# Patient Record
Sex: Male | Born: 1985 | Hispanic: Yes | Marital: Married
Health system: Midwestern US, Academic
[De-identification: ages and names within clinical notes are randomized; demographics above are authoritative.]

---

## 2017-02-09 MED ORDER — CEFAZOLIN INJ 1GM IVP
2 g | Freq: Once | INTRAVENOUS | 0 refills | Status: CN
Start: 2017-02-09 — End: ?

## 2017-02-22 MED ORDER — EPINEPHRINE 3MG NS 3000ML IRR(OR)
0 refills | Status: DC
Start: 2017-02-22 — End: 2017-02-23

## 2017-02-22 MED ORDER — ROCURONIUM 10 MG/ML IV SOLN
INTRAVENOUS | 0 refills | Status: DC
Start: 2017-02-22 — End: 2017-02-22

## 2017-02-22 MED ORDER — ONDANSETRON HCL (PF) 4 MG/2 ML IJ SOLN
4 mg | Freq: Once | INTRAVENOUS | 0 refills | Status: DC | PRN
Start: 2017-02-22 — End: 2017-02-23

## 2017-02-22 MED ORDER — CEFAZOLIN INJ 1GM IVP
2 g | Freq: Once | INTRAVENOUS | 0 refills | Status: CP
Start: 2017-02-22 — End: ?

## 2017-02-22 MED ORDER — ROPIVACAINE (PF) 2 MG/ML (0.2 %) IJ SOLN
0 refills | Status: DC
Start: 2017-02-22 — End: 2017-02-22

## 2017-02-22 MED ORDER — OXYCODONE SR 10 MG PO 12HR TABLET
10 mg | Freq: Once | ORAL | 0 refills | Status: CP
Start: 2017-02-22 — End: ?

## 2017-02-22 MED ORDER — OXYCODONE 5 MG PO TAB
5-10 mg | Freq: Once | ORAL | 0 refills | Status: CP | PRN
Start: 2017-02-22 — End: ?

## 2017-02-22 MED ORDER — SUGAMMADEX 100 MG/ML IV SOLN
INTRAVENOUS | 0 refills | Status: DC
Start: 2017-02-22 — End: 2017-02-22

## 2017-02-22 MED ORDER — FENTANYL CITRATE (PF) 50 MCG/ML IJ SOLN
0 refills | Status: DC
Start: 2017-02-22 — End: 2017-02-22

## 2017-02-22 MED ORDER — GABAPENTIN 300 MG PO CAP
300 mg | Freq: Once | ORAL | 0 refills | Status: CP
Start: 2017-02-22 — End: ?

## 2017-02-22 MED ORDER — PROMETHAZINE 25 MG/ML IJ SOLN
6.25 mg | INTRAVENOUS | 0 refills | Status: DC | PRN
Start: 2017-02-22 — End: 2017-02-23

## 2017-02-22 MED ORDER — MEPERIDINE (PF) 25 MG/ML IJ SYRG
12.5 mg | INTRAVENOUS | 0 refills | Status: DC | PRN
Start: 2017-02-22 — End: 2017-02-23

## 2017-02-22 MED ORDER — MIDAZOLAM 1 MG/ML IJ SOLN
INTRAVENOUS | 0 refills | Status: DC
Start: 2017-02-22 — End: 2017-02-22

## 2017-02-22 MED ORDER — PROPOFOL INJ 10 MG/ML IV VIAL
0 refills | Status: DC
Start: 2017-02-22 — End: 2017-02-22

## 2017-02-22 MED ORDER — HALOPERIDOL LACTATE 5 MG/ML IJ SOLN
1 mg | Freq: Once | INTRAVENOUS | 0 refills | Status: DC | PRN
Start: 2017-02-22 — End: 2017-02-23

## 2017-02-22 MED ORDER — OXYCODONE-ACETAMINOPHEN 5-325 MG PO TAB
1 | ORAL_TABLET | ORAL | 0 refills | 2.00000 days | Status: AC | PRN
Start: 2017-02-22 — End: ?

## 2017-02-22 MED ORDER — DEXAMETHASONE SODIUM PHOSPHATE 4 MG/ML IJ SOLN
INTRAVENOUS | 0 refills | Status: DC
Start: 2017-02-22 — End: 2017-02-22

## 2017-02-22 MED ORDER — ONDANSETRON HCL (PF) 4 MG/2 ML IJ SOLN
INTRAVENOUS | 0 refills | Status: DC
Start: 2017-02-22 — End: 2017-02-22

## 2017-02-22 MED ORDER — HYDROMORPHONE (PF) 2 MG/ML IJ SYRG
.5 mg | INTRAVENOUS | 0 refills | Status: DC | PRN
Start: 2017-02-22 — End: 2017-02-23

## 2017-02-22 MED ORDER — LIDOCAINE (PF) 200 MG/10 ML (2 %) IJ SYRG
0 refills | Status: DC
Start: 2017-02-22 — End: 2017-02-22

## 2017-02-22 MED ORDER — FENTANYL CITRATE (PF) 50 MCG/ML IJ SOLN
50 ug | INTRAVENOUS | 0 refills | Status: DC | PRN
Start: 2017-02-22 — End: 2017-02-23

## 2017-02-22 MED ORDER — DIAZEPAM 5 MG PO TAB
5 mg | ORAL_TABLET | ORAL | 0 refills | 7.00000 days | Status: AC | PRN
Start: 2017-02-22 — End: ?

## 2017-02-22 MED ORDER — ACETAMINOPHEN 1,000 MG/100 ML (10 MG/ML) IV SOLN
0 refills | Status: DC
Start: 2017-02-22 — End: 2017-02-22

## 2017-02-22 MED ORDER — LIDOCAINE (PF) 10 MG/ML (1 %) IJ SOLN
.1-2 mL | Freq: Once | INTRAMUSCULAR | 0 refills | Status: CP
Start: 2017-02-22 — End: ?

## 2017-02-22 MED ORDER — FENTANYL CITRATE (PF) 50 MCG/ML IJ SOLN
25 ug | INTRAVENOUS | 0 refills | Status: DC | PRN
Start: 2017-02-22 — End: 2017-02-23

## 2017-02-22 MED ORDER — LACTATED RINGERS IV SOLP
INTRAVENOUS | 0 refills | Status: DC
Start: 2017-02-22 — End: 2017-02-23

## 2017-04-01 ENCOUNTER — Encounter: Admit: 2017-04-01 | Discharge: 2017-04-01

## 2017-06-27 ENCOUNTER — Encounter: Admit: 2017-06-27 | Discharge: 2017-06-27

## 2017-06-27 NOTE — Telephone Encounter
Instructed patient's wife to call and reschedule an appointment to obtain a return to work note, patient no showed last appointment.  Questions answered, instructed patient to call [phone] with any other questions or concerns.

## 2017-07-13 ENCOUNTER — Ambulatory Visit: Admit: 2017-07-13 | Discharge: 2017-07-14

## 2017-07-13 ENCOUNTER — Encounter: Admit: 2017-07-13 | Discharge: 2017-07-13

## 2017-07-13 DIAGNOSIS — E079 Disorder of thyroid, unspecified: Principal | ICD-10-CM

## 2017-07-13 DIAGNOSIS — K929 Disease of digestive system, unspecified: ICD-10-CM

## 2017-07-13 NOTE — Progress Notes
CC:  Status post Right hip arthroscopy hardware removal, labral repair, acetabuloplasty, femoroplasty 02/22/17     HPI:  Mitchell Petersen is a 31 y.o. male who presents today for routine 4.5 month post-operative follow up of his hip arthroscopy.   He is doing well with minimal complaints.  He has completed physical therapy. He states his sister is a physical therapist. He is very thankful for his surgical results. He reports no pain. He would like to return to full duty at a [employer]. He and his wife have moved 2.5 hours away. No other concerns today.    Physical Exam:    Healthy appearing 31 y.o. male in no acute distress.  Alert and oriented.  Constitutional:  Appropriate mood and affect.  Normal responses.  HEENT:  Head is atraumatic, normocephalic.  Eyes are anicteric, they track normally.  Mucous membranes are moist.  Neck:  Soft and supple.  Chest:  Normal excursion, nonlabored breathing.  Heart:  Palpable pulses distally.  Abdomen:  Nonobese, nondistended.    Right Hip Exam:  Incisions healed.  Skin intact.  No erythema or induration. Range of motion is 5 degrees IR and 30 degrees ER.  FABER negative.  FADIR negative.  Scour negative.  Stintchfield testing is negative.  Strength is good.      Impression: 31 y.o. male 4.5 months status post the above surgery doing well.    Plan:  He may progress activities as tolerated.  We discussed precautions moving forward.  He was provided with refill medications as listed below.  I will see him back as needed at this point. I gave him the option to see Dr. Royann Shivers in six weeks if desired.  All of his questions were answered today.  He was comfortable with the plan at the end of the visit.      BP 131/68  - Pulse 50  - Resp 15  - Ht 180.3 cm (71)  - Wt 115.7 kg (255 lb)  - SpO2 100%  - BMI 35.57 kg/m???     No orders of the defined types were placed in this encounter.      Current Medications: ??? diazePAM (VALIUM) 5 mg tablet Take 1 tablet by mouth every 6 hours as needed for Anxiety.   ??? lansoprazole(+) DR (PREVACID) 15 mg capsule Take 15 mg by mouth daily 30 minutes before breakfast.   ??? levothyroxine (SYNTHROID) 50 mcg tablet Take 50 mcg by mouth daily 30 minutes before breakfast.   ??? oxyCODONE/acetaminophen (ENDOCET) 5/325 mg tablet Take 1 tablet by mouth every 4 hours as needed for Pain     Allergies   Allergen Reactions   ??? Adhesive Tape (Rosins) HIVES   ??? Celebrex [Celecoxib] HIVES   ??? Latex HIVES   ??? Toradol [Ketorolac] HIVES   ??? Tramadol HIVES

## 2017-07-14 DIAGNOSIS — M25851 Other specified joint disorders, right hip: Principal | ICD-10-CM

## 2018-02-02 ENCOUNTER — Encounter: Admit: 2018-02-02 | Discharge: 2018-02-02

## 2018-02-02 DIAGNOSIS — K929 Disease of digestive system, unspecified: ICD-10-CM

## 2018-02-02 DIAGNOSIS — E079 Disorder of thyroid, unspecified: Principal | ICD-10-CM

## 2018-10-31 IMAGING — CR CHEST
2 series · 2 of 2 positions shown · non-contrast
Comparison: none

[chest pa x-wise]
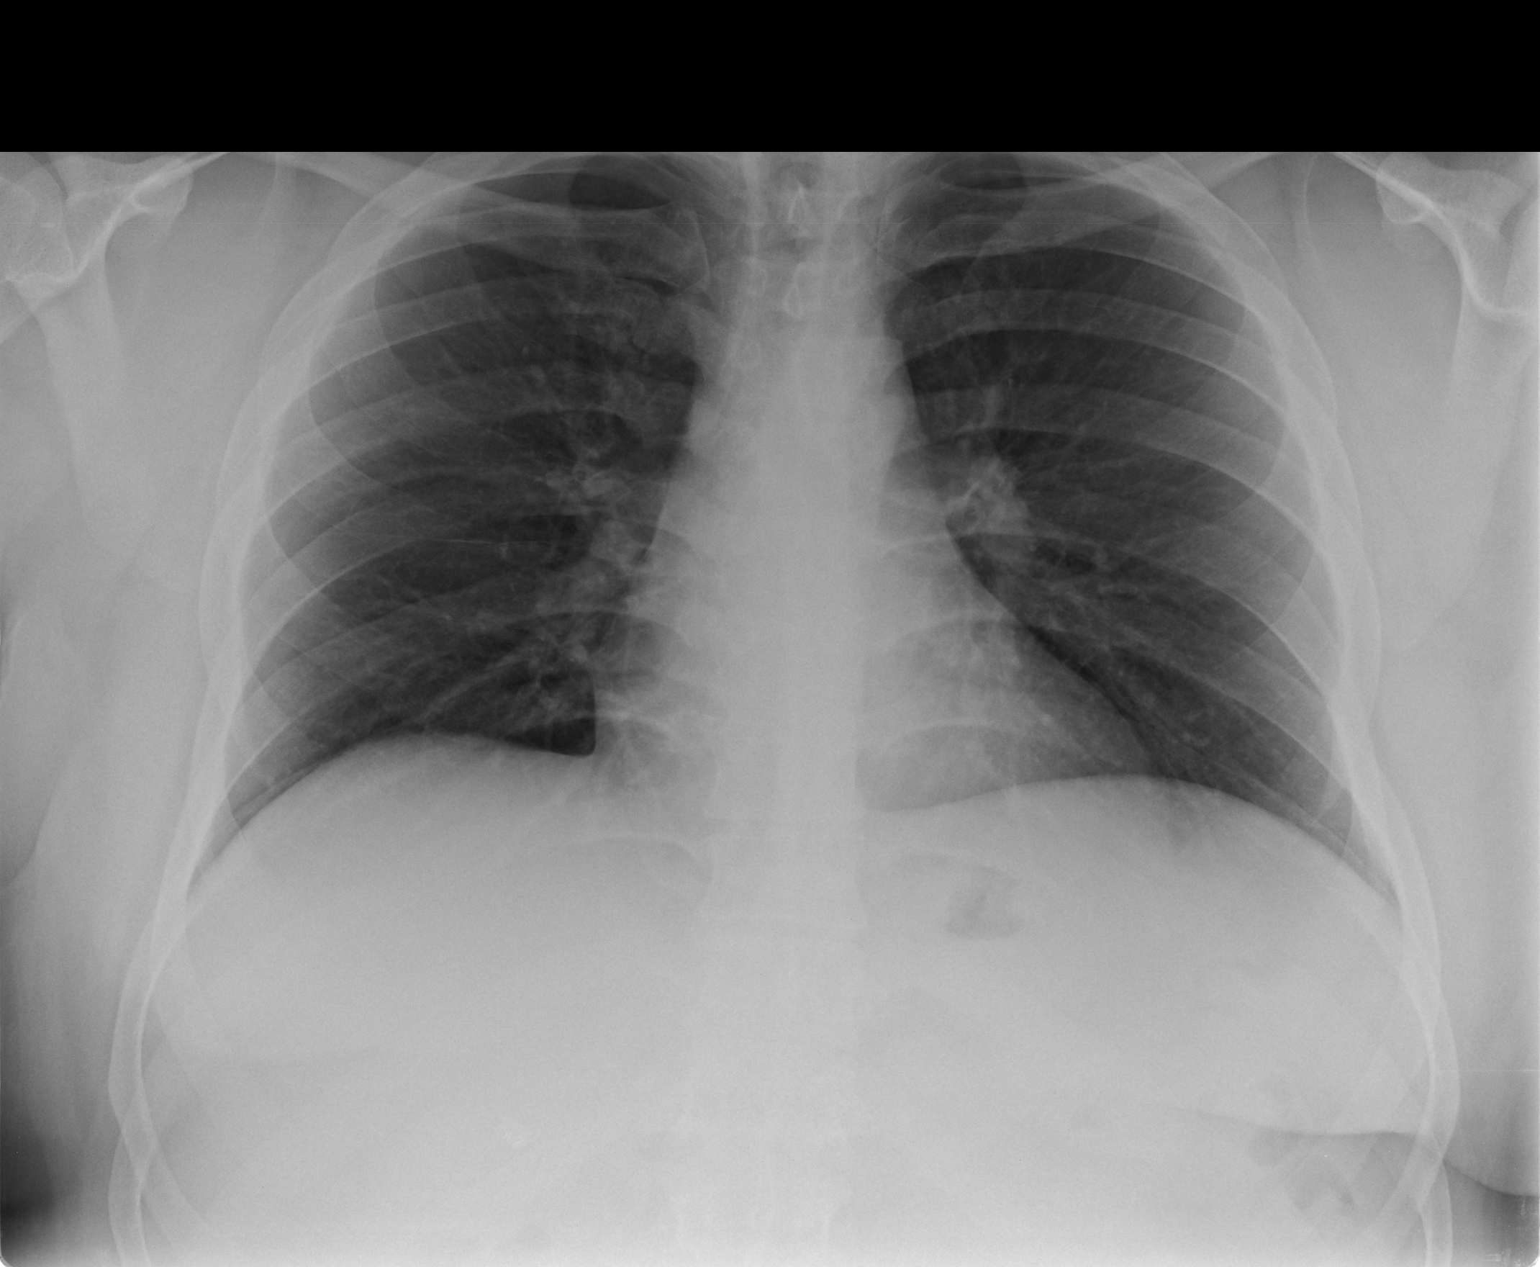

[chest lat]
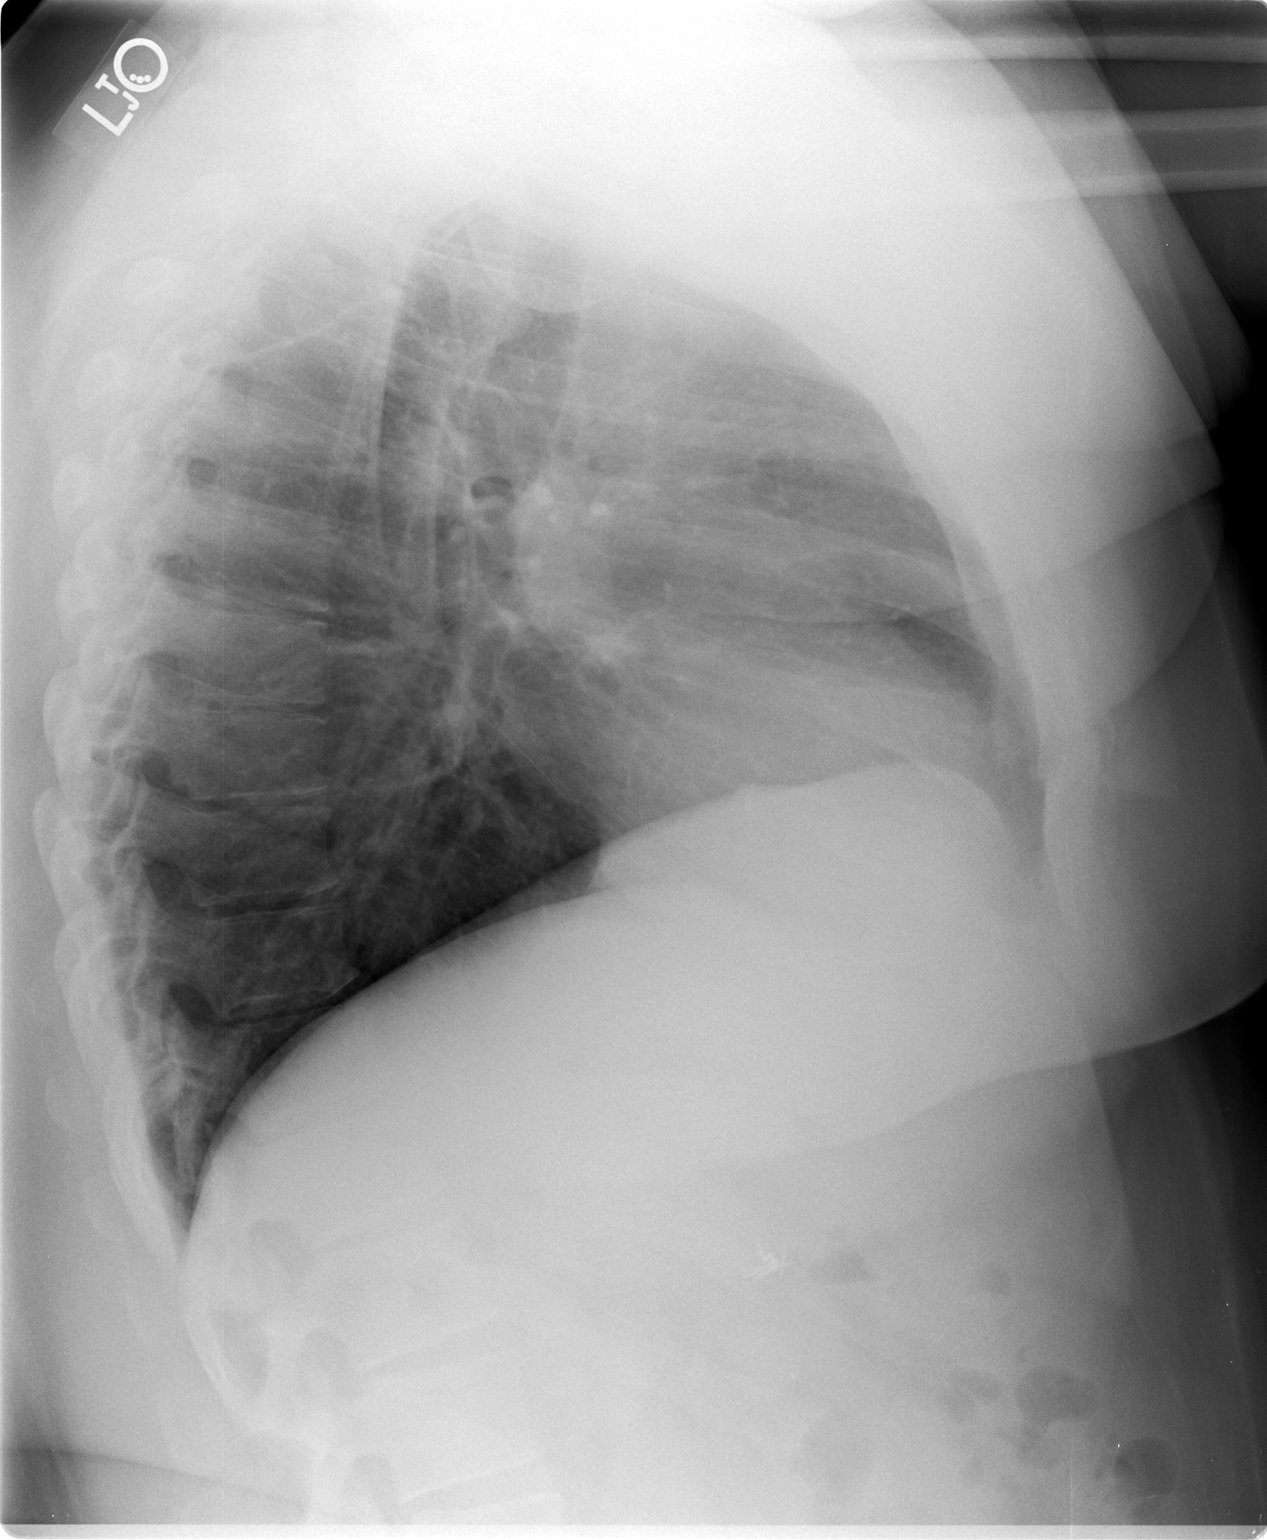

[2 of 2 positions shown; findings below may reference images not displayed]

------------- REPORT [id number] -------------
EXAM

XR chest 2V

INDICATION

Positive TB test

TECHNIQUE

PA and Lateral views of the chest

COMPARISONS

None available at the time of dictation.

FINDINGS

No radiographically apparent pleural effusion, consolidation, or pneumothorax.

The cardiomediastinal silhouette is normal in size.

The osseous structures are without an acute osseous abnormality.

IMPRESSION
1. No radiographic evidence of an acute cardiopulmonary process.

Tech Notes:

POSITIVE TB TEST. PT SHIELDED. TJ

------------- REPORT [id number] -------------
EXAM

XR chest 2V

INDICATION

Positive TB test

TECHNIQUE

PA and Lateral views of the chest

COMPARISONS

None available at the time of dictation.

FINDINGS

No radiographically apparent pleural effusion, consolidation, or pneumothorax.

The cardiomediastinal silhouette is normal in size.

The osseous structures are without an acute osseous abnormality.

IMPRESSION
1. No radiographic evidence of an acute cardiopulmonary process.

Tech Notes:

POSITIVE TB TEST. PT SHIELDED. TJ

## 2018-12-16 IMAGING — CT BRAIN WO(Adult)
1 series · 2 of 4 positions shown, 3 images · non-contrast
Comparison: none

[Series 2: brain ax 5.00 hr40 s3 · axial · 0.36mm/px · z∈[-533,-528]mm · 2 of 4 slices shown, 3 images]
[im 2/4  brain]
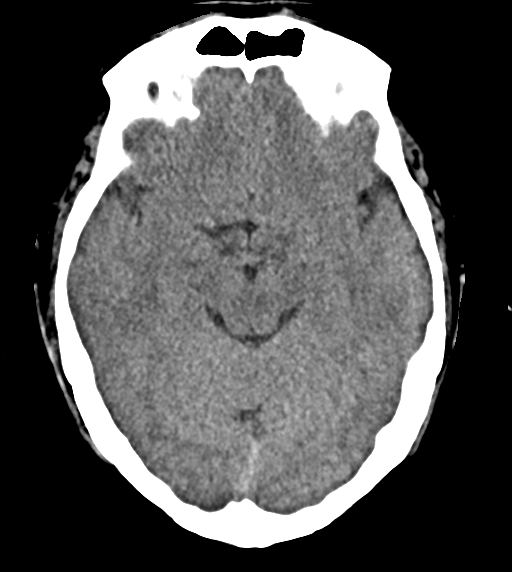
[im 2/4  bone]
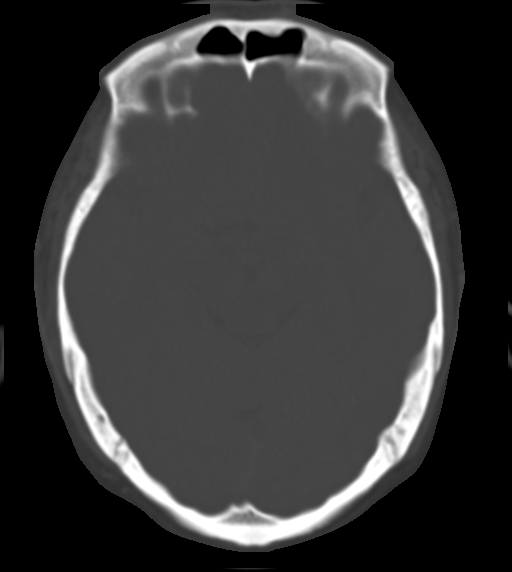
[im 3/4  brain]
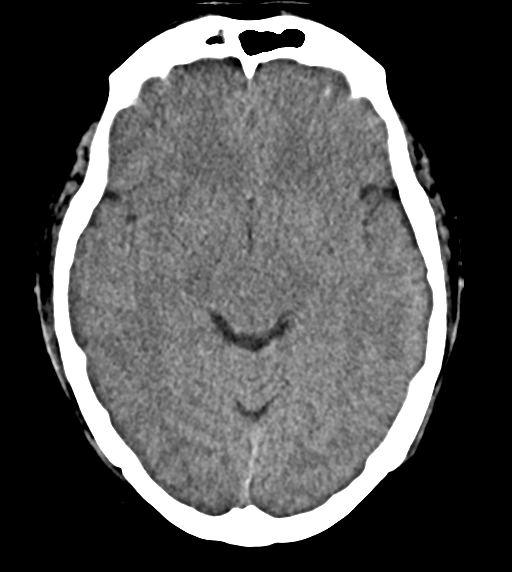

[2 of 4 positions shown; findings below may reference images not displayed]

------------- REPORT [id number] -------------
EXAM
CT head without contast.

INDICATION
left facial numbness
left facial numbness/tingling - AK CT/NM-0/0

TECHNIQUE
Volumetric multi detector CT images of the head were obtained without the administration of IV
contrast.
All CT scans at this facility use dose modulation, iterative reconstruction, and/or weight based
dosing when appropriate to reduce radiation dose to as low as reasonably achievable.

COMPARISONS
[None available].

FINDINGS
There is no intra-axial or extra-axial fluid collection. There is no midline shift or mass effect.
The ventricles and sulci are normal in size and position. The parenchyma is grossly normal in
attenuation with preserved gray-white differentiation. The visualized paranasal sinuses demonstrates
moderate polypoid mucosal thickening.. The mastoid air cells well aerated. The bony calvarium is
intact. The orbits and their contents are unremarkable.

IMPRESSION
1. No acute intracranial abnormality.

Tech Notes:

left facial numbness/tingling - AK
CT/NM-0/0

------------- REPORT [id number] -------------
EXAM
CT head without contast.

INDICATION
left facial numbness
left facial numbness/tingling - AK CT/NM-0/0

TECHNIQUE
Volumetric multi detector CT images of the head were obtained without the administration of IV
contrast.
All CT scans at this facility use dose modulation, iterative reconstruction, and/or weight based
dosing when appropriate to reduce radiation dose to as low as reasonably achievable.

COMPARISONS
[None available].

FINDINGS
There is no intra-axial or extra-axial fluid collection. There is no midline shift or mass effect.
The ventricles and sulci are normal in size and position. The parenchyma is grossly normal in
attenuation with preserved gray-white differentiation. The visualized paranasal sinuses demonstrates
moderate polypoid mucosal thickening.. The mastoid air cells well aerated. The bony calvarium is
intact. The orbits and their contents are unremarkable.

IMPRESSION
1. No acute intracranial abnormality.

Tech Notes:

left facial numbness/tingling - AK
CT/NM-0/0

## 2019-05-14 IMAGING — CT ABDOMEN_PELVIS W(Adult)
2 of 3 series · 12 of 46 positions shown, 14 images · IV contrast (Omnipaque)
Comparison: none

[Series 2: abdomen ax 3.00 br40 s3 · axial · 0.70mm/px · z∈[+1439,+1868]mm · 9 of 97 slices shown, 11 images]
[im 7/97  soft-tissue]
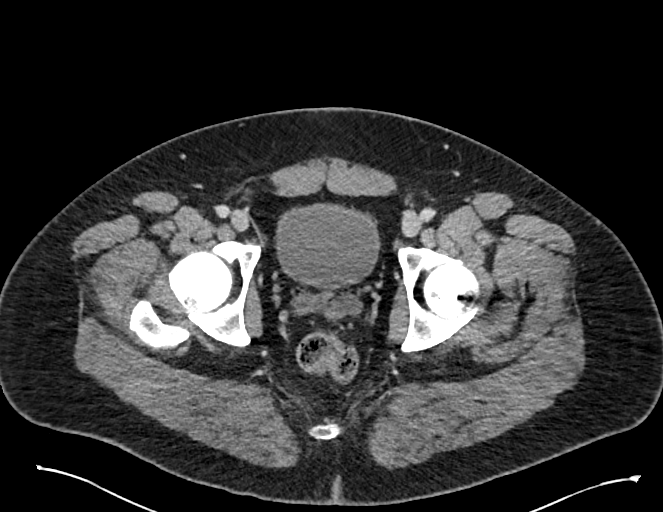
[im 7/97  bone]
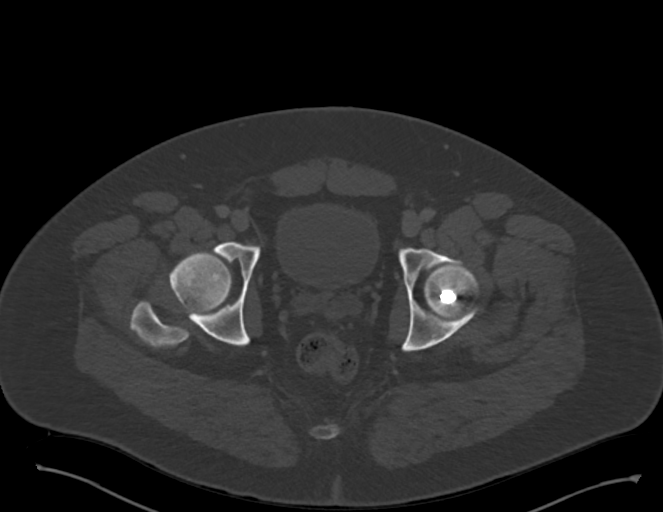
[im 19/97  soft-tissue]
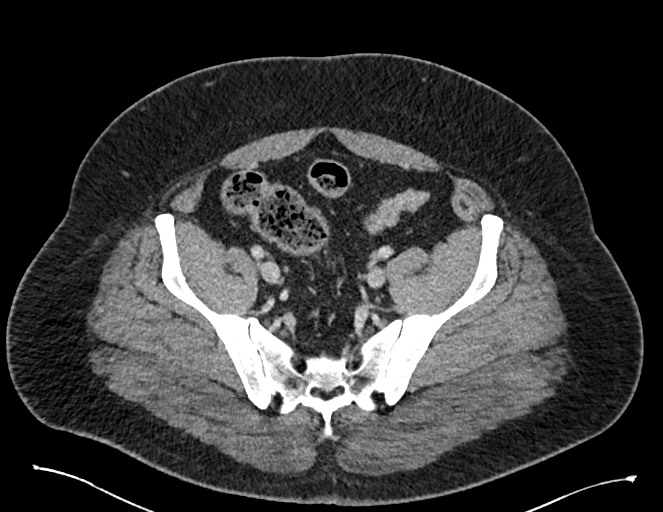
[im 28/97  soft-tissue]
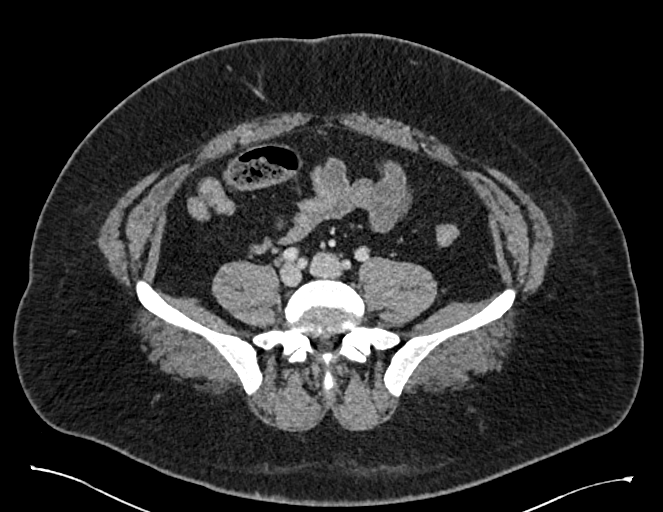
[im 38/97  soft-tissue]
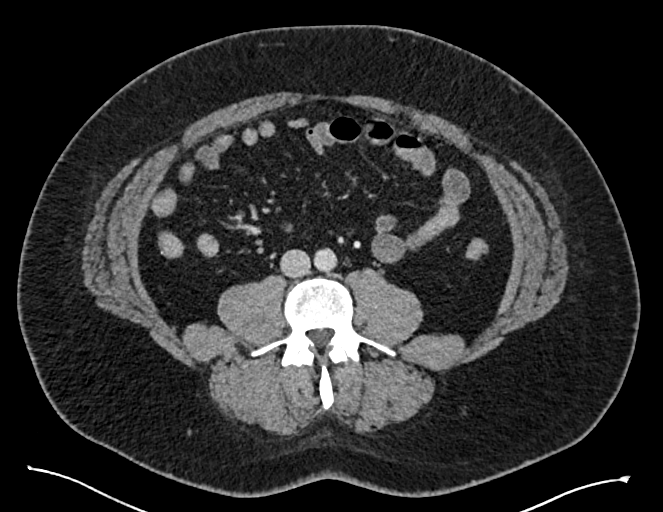
[im 50/97  soft-tissue]
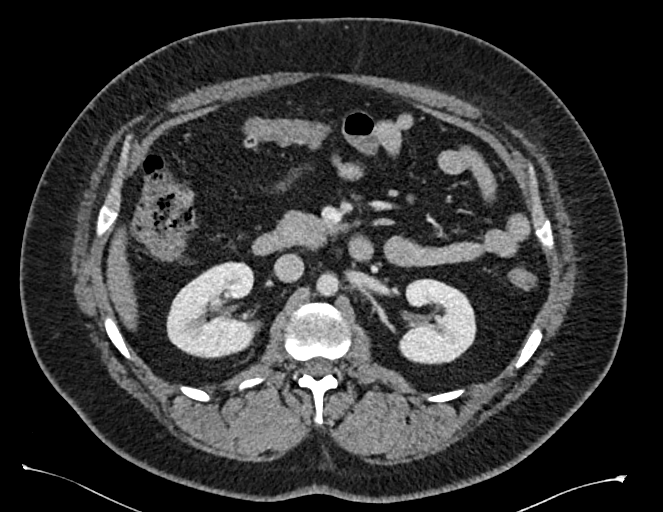
[im 59/97  soft-tissue]
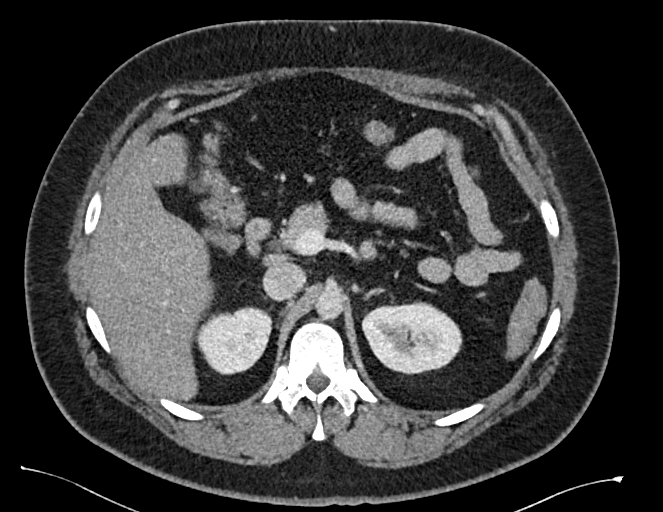
[im 69/97  soft-tissue]
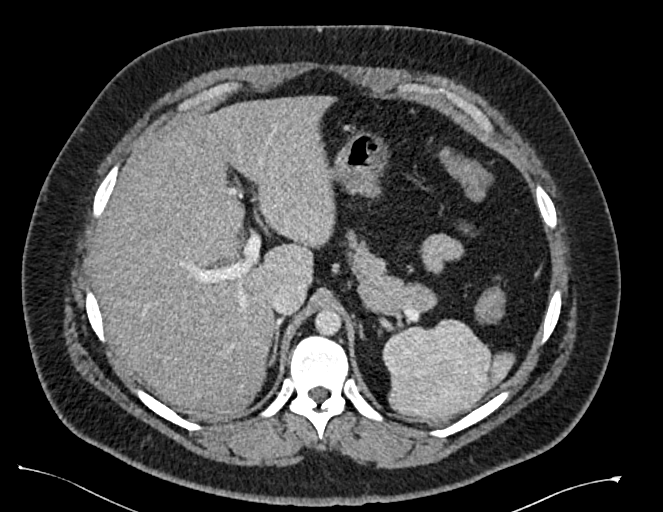
[im 81/97  soft-tissue]
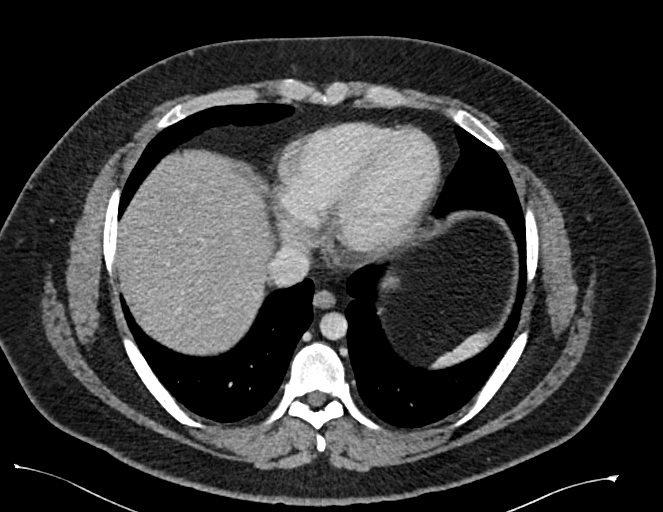
[im 90/97  soft-tissue]
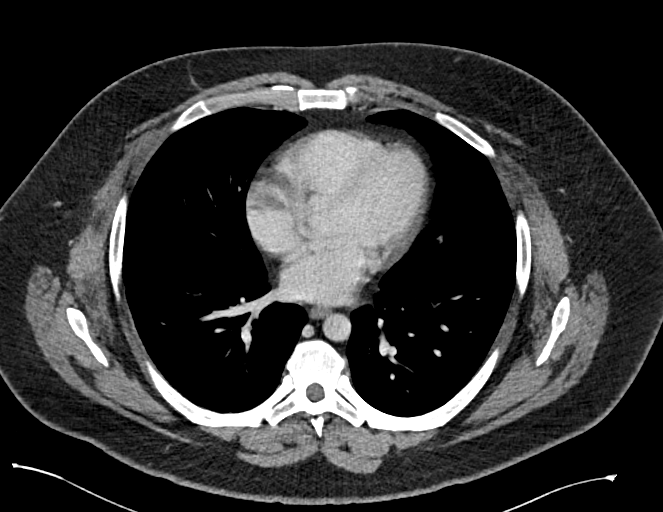
[im 90/97  bone]
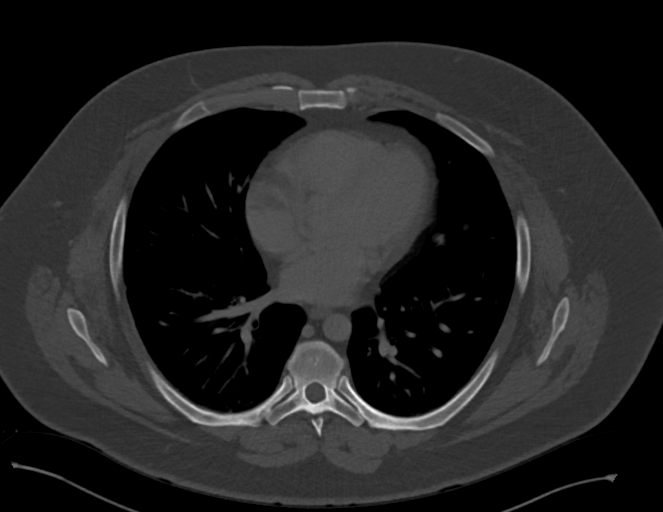

[Series 4: abdomen cor 3.00 br40 s3 · coronal · 0.91mm/px · 3 of 71 slices shown]
[im 24/71  soft-tissue]
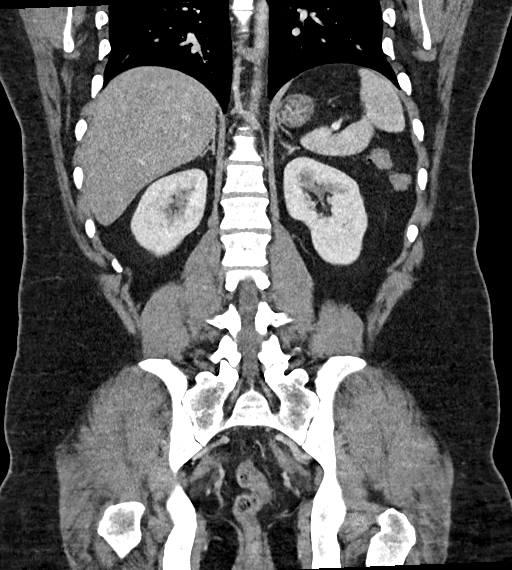
[im 32/71  soft-tissue]
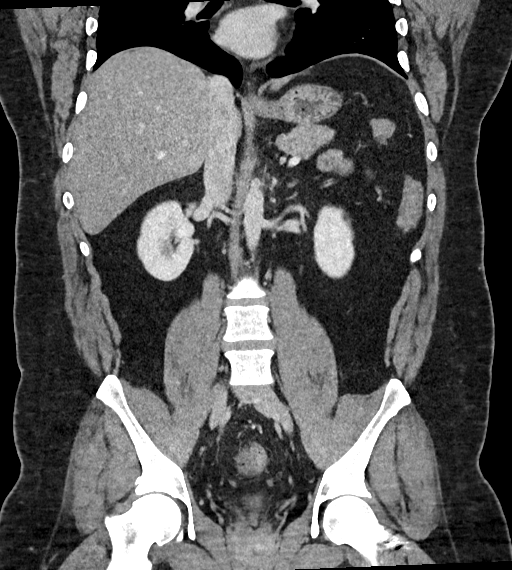
[im 39/71  soft-tissue]
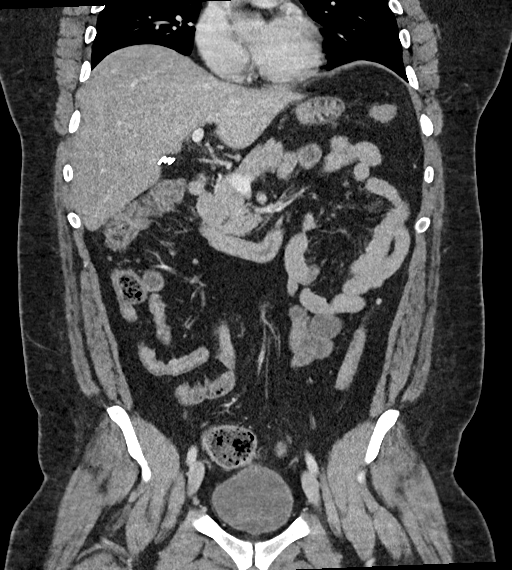

[12 of 46 positions shown; findings below may reference images not displayed]

------------- REPORT [id number] -------------
DIAGNOSTIC STUDIES

EXAM

COMPUTED TOMOGRAPHY, ABDOMEN AND PELVIS; WITH CONTRAST MATERIAL CPT 23045

INDICATION

Right upper quadrant and mid epigastric pain. History of pancreatitis.

TECHNIQUE

Contiguous axial tomographic images were obtained from the lung bases to the symphysis pubis with
both oral and intravenous contrast.

All CT scans at this facility use dose modulation, iterative reconstruction, and/or weight based
dosing when appropriate to reduce radiation dose to as low as reasonably achievable.

O CT and nuclear scans in the last year.

COMPARISONS

No priors available for comparison.

FINDINGS

The lung bases are clear. Moderate hepatic steatosis. Cholecystectomy. The spleen, pancreas and
adrenal glands appear grossly unremarkable. No hydronephrosis. No bowel obstruction or free air.
Mild colonic diverticulosis. Prior appendectomy. Moderate gastric mucosal thickening. No aortic
aneurysm. No pathologically enlarged lymph nodes. The urinary bladder is incompletely distended. The
prostate and seminal vesicles appear grossly unremarkable. Cannulated screw within the left femoral
head. Prior hardware removal the right femoral.

IMPRESSION

1. Gastritis. Follow-up with a gastroenterology consultation is recommended.

2. Prior cholecystectomy and appendectomy.

Tech Notes:

PT c/o RUQ/mid epigastric pain. H/O pancreatitis.
100ml OMNI 300
JC/TJ

------------- REPORT [id number] -------------
DIAGNOSTIC STUDIES

EXAM

COMPUTED TOMOGRAPHY, ABDOMEN AND PELVIS; WITH CONTRAST MATERIAL CPT 23045

INDICATION

Right upper quadrant and mid epigastric pain. History of pancreatitis.

TECHNIQUE

Contiguous axial tomographic images were obtained from the lung bases to the symphysis pubis with
both oral and intravenous contrast.

All CT scans at this facility use dose modulation, iterative reconstruction, and/or weight based
dosing when appropriate to reduce radiation dose to as low as reasonably achievable.

O CT and nuclear scans in the last year.

COMPARISONS

No priors available for comparison.

FINDINGS

The lung bases are clear. Moderate hepatic steatosis. Cholecystectomy. The spleen, pancreas and
adrenal glands appear grossly unremarkable. No hydronephrosis. No bowel obstruction or free air.
Mild colonic diverticulosis. Prior appendectomy. Moderate gastric mucosal thickening. No aortic
aneurysm. No pathologically enlarged lymph nodes. The urinary bladder is incompletely distended. The
prostate and seminal vesicles appear grossly unremarkable. Cannulated screw within the left femoral
head. Prior hardware removal the right femoral.

IMPRESSION

1. Gastritis. Follow-up with a gastroenterology consultation is recommended.

2. Prior cholecystectomy and appendectomy.

Tech Notes:

PT c/o RUQ/mid epigastric pain. H/O pancreatitis.
100ml OMNI 300
JC/TJ

## 2019-07-02 IMAGING — CT ABDOMEN_PELVIS W(Adult)
2 of 3 series · 11 of 46 positions shown, 12 images · IV contrast (Omnipaque)
Comparison: none

[Series 2: abdomen ax 3.00 br40 s3 · axial · 0.66mm/px · z∈[+1221,+1683]mm · 8 of 166 slices shown, 9 images]
[im 11/166  soft-tissue]
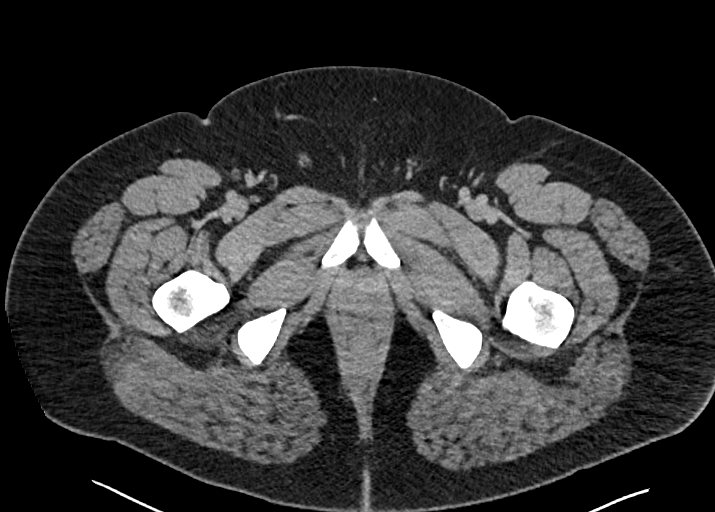
[im 11/166  bone]
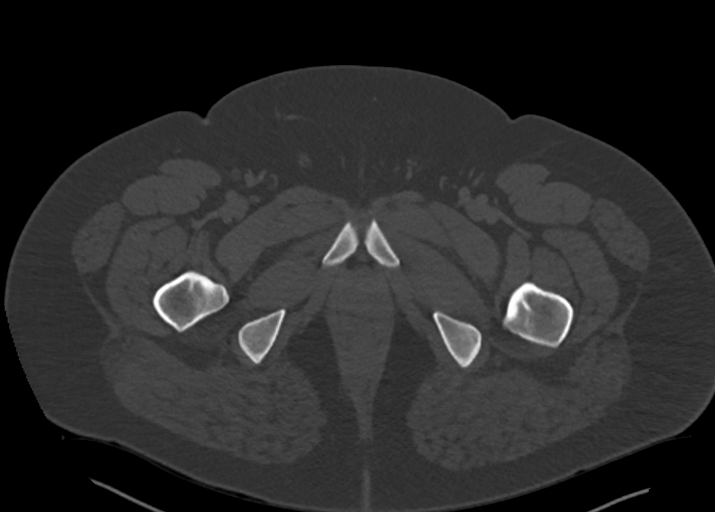
[im 32/166  soft-tissue]
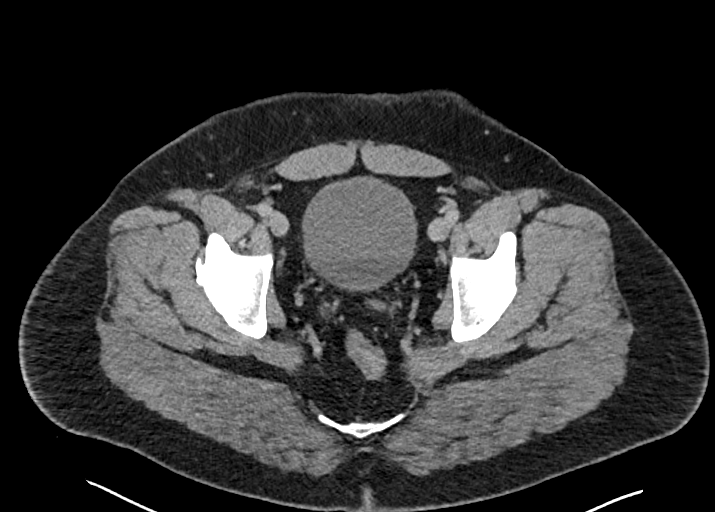
[im 54/166  soft-tissue]
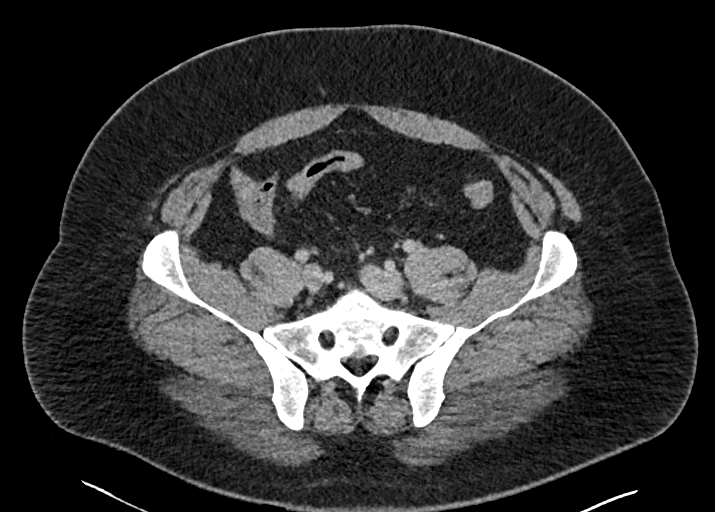
[im 75/166  soft-tissue]
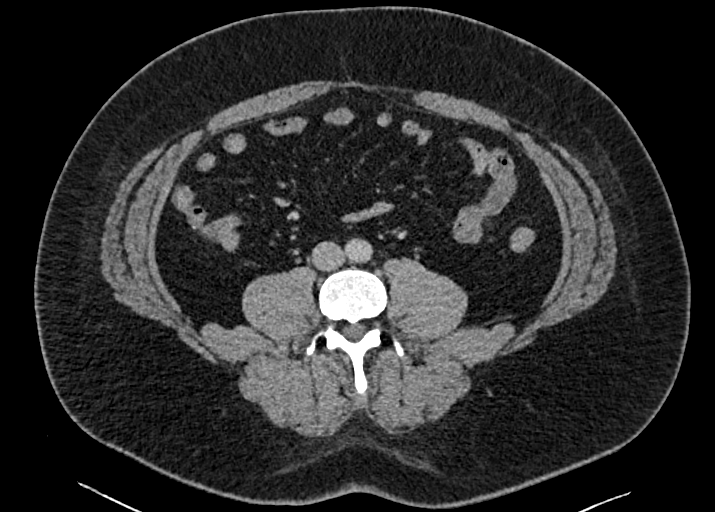
[im 91/166  soft-tissue]
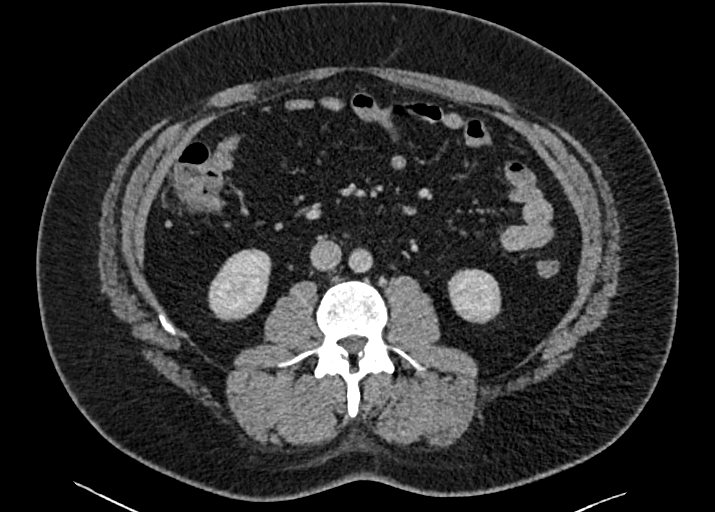
[im 112/166  soft-tissue]
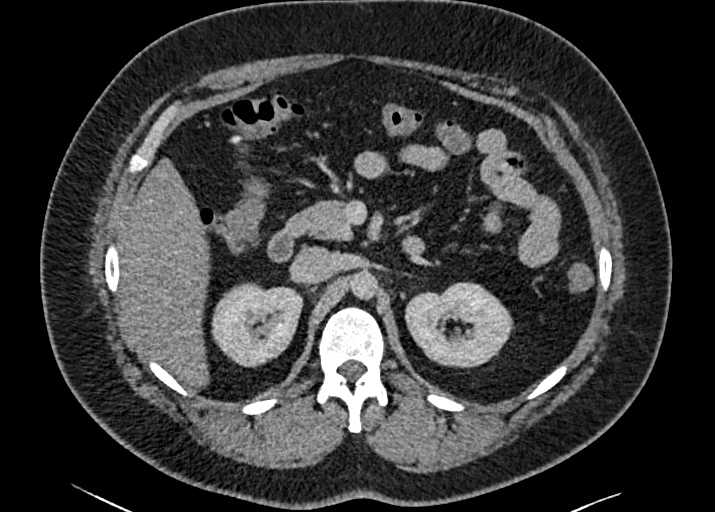
[im 134/166  soft-tissue]
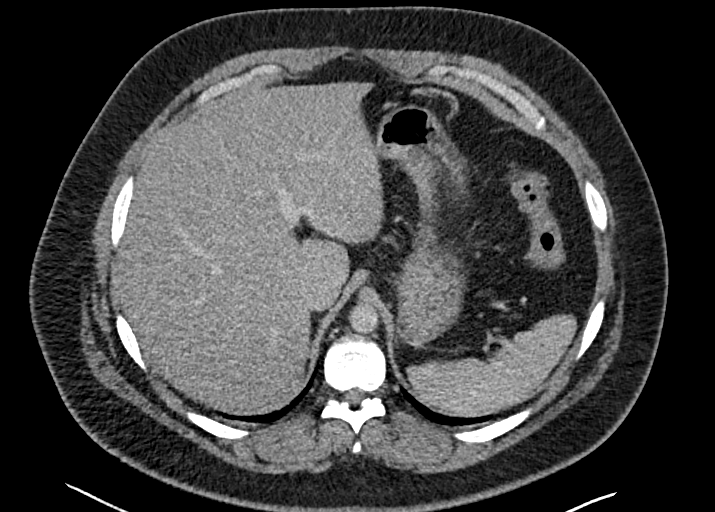
[im 155/166  soft-tissue]
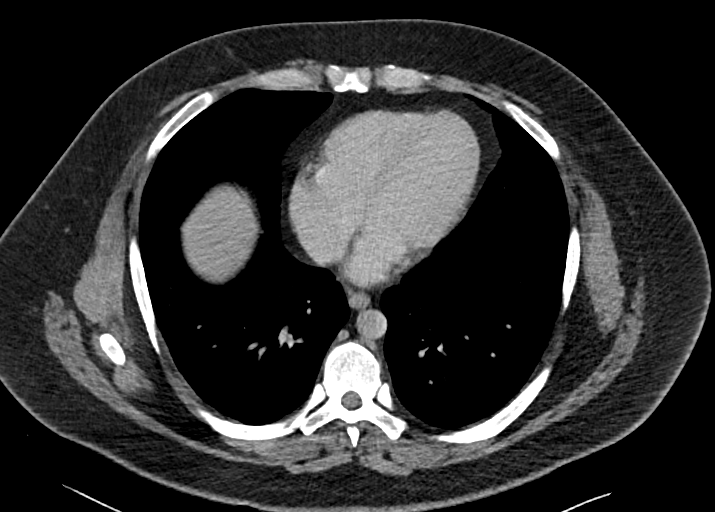

[Series 4: abdomen cor 3.00 br40 s3 · coronal · 0.92mm/px · 3 of 96 slices shown]
[im 32/96  soft-tissue]
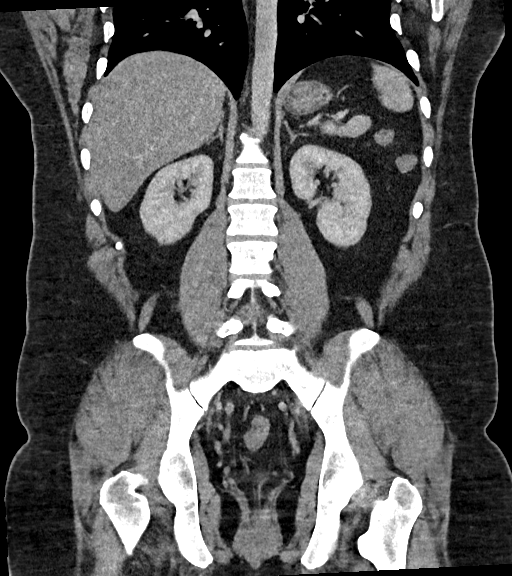
[im 43/96  soft-tissue]
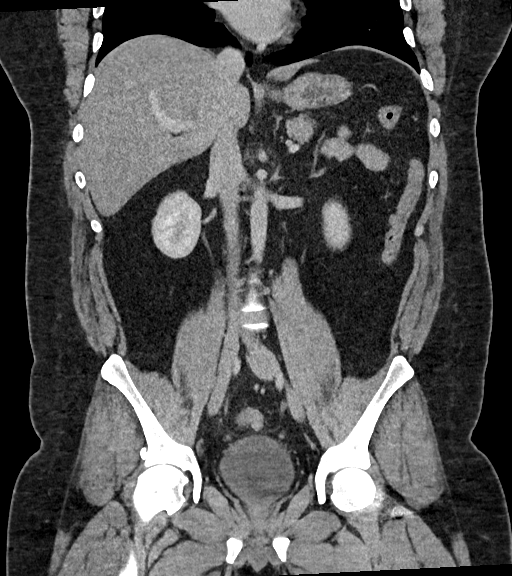
[im 53/96  soft-tissue]
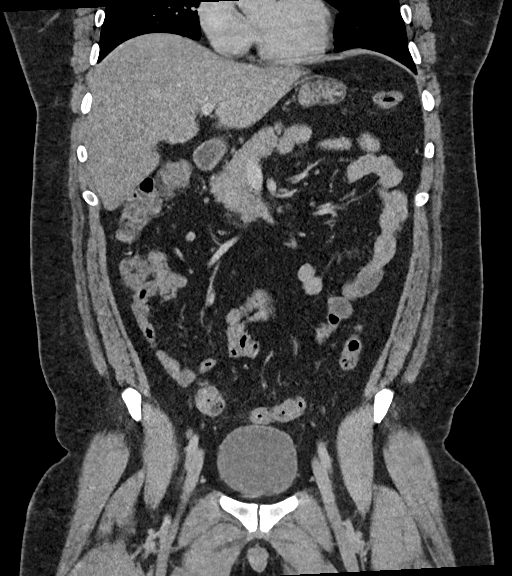

[11 of 46 positions shown; findings below may reference images not displayed]

------------- REPORT [id number] -------------
EXAM
CT abdomen pelvis with contrast.

INDICATION
upper abd pain
LUQ PAIN, NAUSEA, NO VOMITING, NO FEVER. PAIN WORSE AFTER EATING. HX CHOLE, APPY AND HERNIA REPAIR
AS A KID. 50 ML OMNIPAQUE 300

FINDINGS
The prior study was reviewed from 10/08/2019.
The lung bases are clear.
The liver and spleen appear normal. There has been prior cholecystectomy. The pancreas, adrenals
and kidneys appear normal. There prior appendectomy.
There are scattered small mesenteric lymph nodes in the right lower abdominal quadrant, unchanged.
There are no pathologically enlarged lymph nodes.
There is no bowel obstruction. No inflammatory process or mass lesion is identified. The bladder
contour is normal.
There are no acute bone lesions.

IMPRESSION
There is no acute appearing CT abnormality of the abdomen or pelvis. There are few slightly
prominent mesenteric lymph nodes in the right lower abdominal quadrant, unchanged, possibly
representing mild mesenteric lymphadenitis.

Tech Notes:

LUQ PAIN, NAUSEA, NO VOMITING, NO FEVER. PAIN WORSE AFTER EATING.   HX CHOLE, APPY AND HERNIA REPAIR
AS A KID.  50 ML OMNIPAQUE 300

------------- REPORT [id number] -------------
**ADDENDUM**
ADDENDUM

All CT scans at this facility use dose modulation, iterative reconstruction, and/or weight based
dosing when appropriate to reduce radiation dose to as low as reasonably achievable.

Number of previous computed tomography exams in the last 12 months is 4.

Number of previous nuclear medicine myocardial perfusion studies in the last 12 months is 4.

TD/TT: /

EXAM
CT abdomen pelvis with contrast.

INDICATION
upper abd pain
LUQ PAIN, NAUSEA, NO VOMITING, NO FEVER. PAIN WORSE AFTER EATING. HX CHOLE, APPY AND HERNIA REPAIR
AS A KID. 50 ML OMNIPAQUE 300

FINDINGS
The prior study was reviewed from 10/08/2019.
The lung bases are clear.
The liver and spleen appear normal. There has been prior cholecystectomy. The pancreas, adrenals
and kidneys appear normal. There prior appendectomy.
There are scattered small mesenteric lymph nodes in the right lower abdominal quadrant, unchanged.
There are no pathologically enlarged lymph nodes.
There is no bowel obstruction. No inflammatory process or mass lesion is identified. The bladder
contour is normal.
There are no acute bone lesions.

IMPRESSION
There is no acute appearing CT abnormality of the abdomen or pelvis. There are few slightly
prominent mesenteric lymph nodes in the right lower abdominal quadrant, unchanged, possibly
representing mild mesenteric lymphadenitis.

Tech Notes:

LUQ PAIN, NAUSEA, NO VOMITING, NO FEVER. PAIN WORSE AFTER EATING.   HX CHOLE, APPY AND HERNIA REPAIR
AS A KID.  50 ML OMNIPAQUE 300

------------- REPORT [id number] -------------
**ADDENDUM**
ADDENDUM

All CT scans at this facility use dose modulation, iterative reconstruction, and/or weight based
dosing when appropriate to reduce radiation dose to as low as reasonably achievable.

Number of previous computed tomography exams in the last 12 months is 4.

Number of previous nuclear medicine myocardial perfusion studies in the last 12 months is 4.

TD/TT: /

EXAM
CT abdomen pelvis with contrast.

INDICATION
upper abd pain
LUQ PAIN, NAUSEA, NO VOMITING, NO FEVER. PAIN WORSE AFTER EATING. HX CHOLE, APPY AND HERNIA REPAIR
AS A KID. 50 ML OMNIPAQUE 300

FINDINGS
The prior study was reviewed from 10/08/2019.
The lung bases are clear.
The liver and spleen appear normal. There has been prior cholecystectomy. The pancreas, adrenals
and kidneys appear normal. There prior appendectomy.
There are scattered small mesenteric lymph nodes in the right lower abdominal quadrant, unchanged.
There are no pathologically enlarged lymph nodes.
There is no bowel obstruction. No inflammatory process or mass lesion is identified. The bladder
contour is normal.
There are no acute bone lesions.

IMPRESSION
There is no acute appearing CT abnormality of the abdomen or pelvis. There are few slightly
prominent mesenteric lymph nodes in the right lower abdominal quadrant, unchanged, possibly
representing mild mesenteric lymphadenitis.

Tech Notes:

LUQ PAIN, NAUSEA, NO VOMITING, NO FEVER. PAIN WORSE AFTER EATING.   HX CHOLE, APPY AND HERNIA REPAIR
AS A KID.  50 ML OMNIPAQUE 300

## 2019-09-19 IMAGING — MR Shoulder^ROUTINE
4 of 6 series · 18 of 40 positions shown · non-contrast
Comparison: none

[Series 3: T2 fat-sat · axial · 4.0mm · 0.35mm/px · z∈[-44,+61]mm · 8 of 23 slices shown (1 of 2)]
[im 1/23]
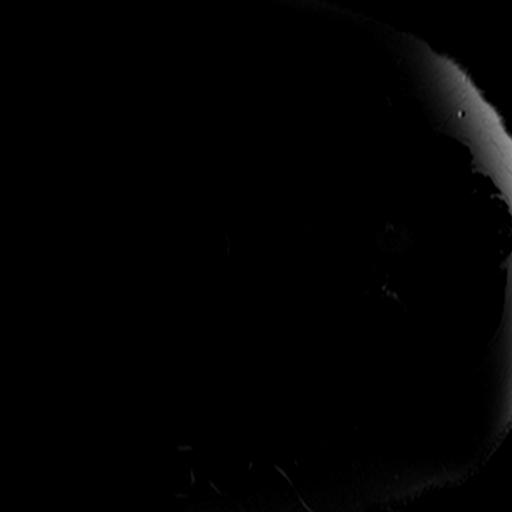
[im 4/23]
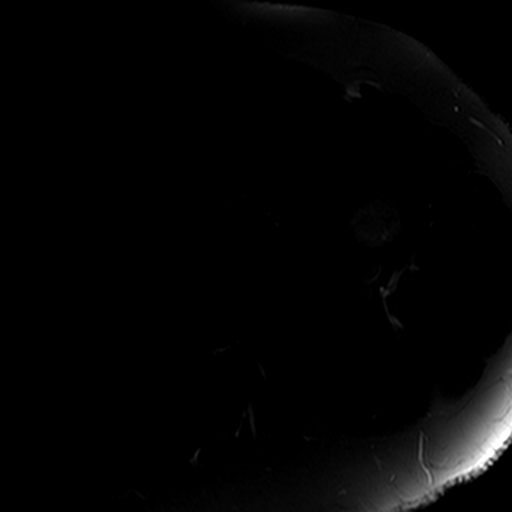
[im 7/23]
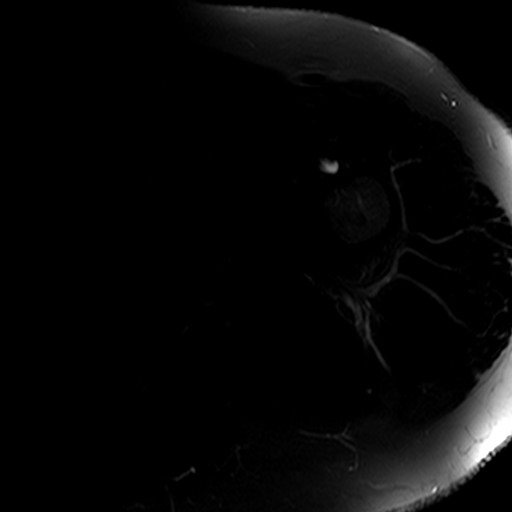
[im 10/23]
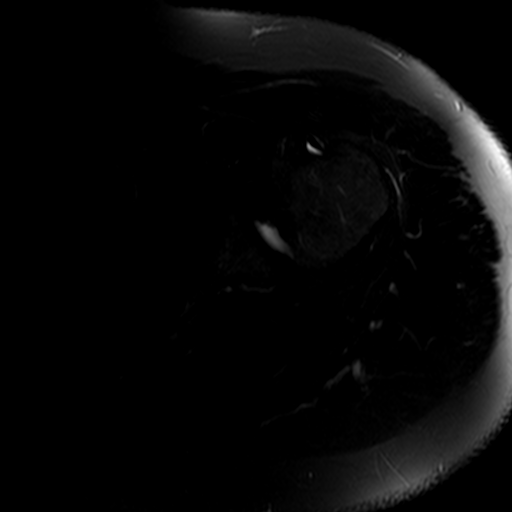
[im 13/23]
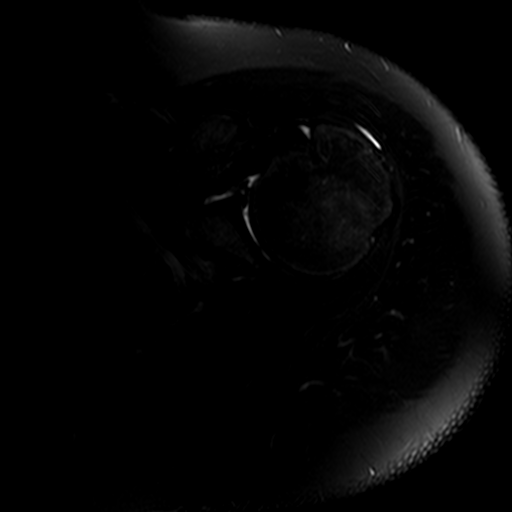
[im 16/23]
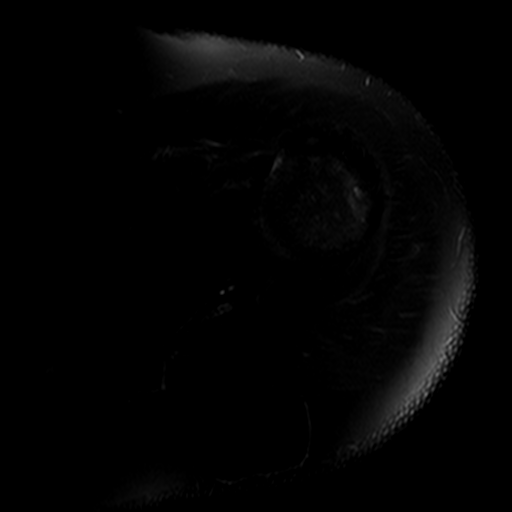
[im 19/23]
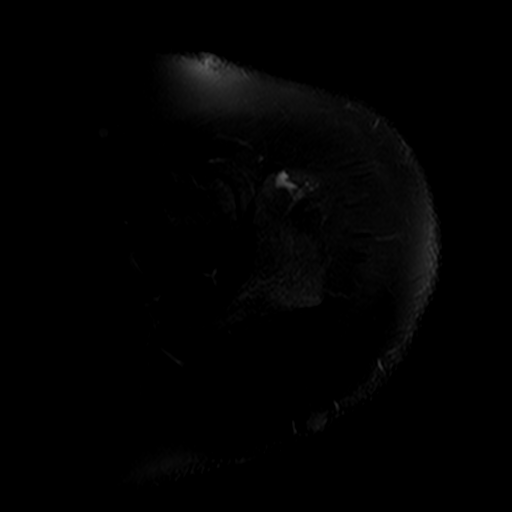
[im 23/23]
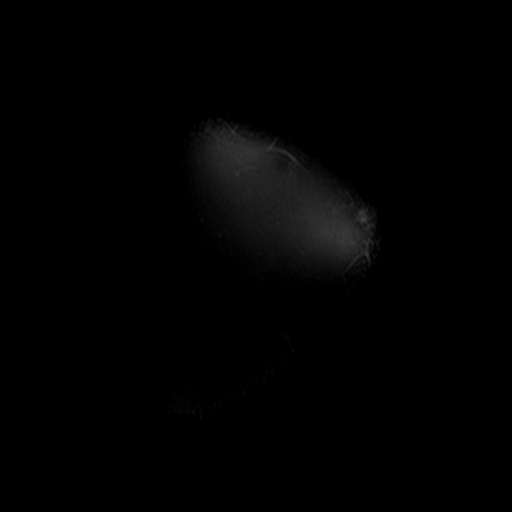

[Series 4: T2 fat-sat · oblique · 4.0mm · 0.27mm/px · 4 of 18 slices shown (2 of 2)]
[im 1/18]
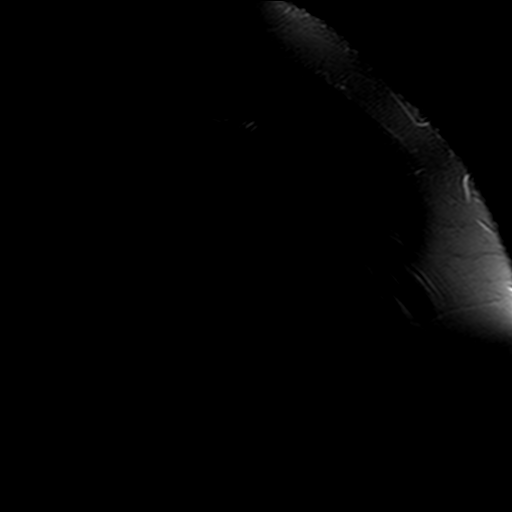
[im 4/18]
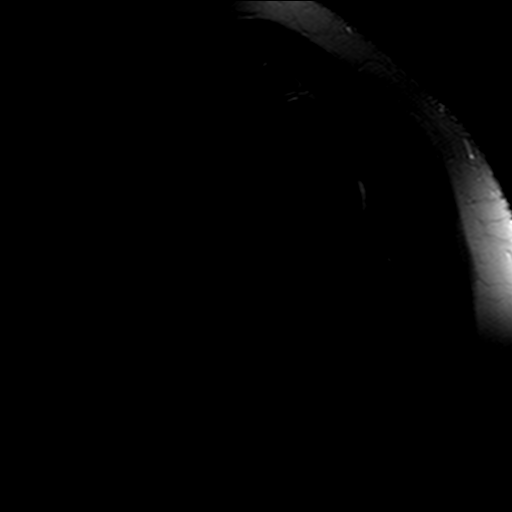
[im 11/18]
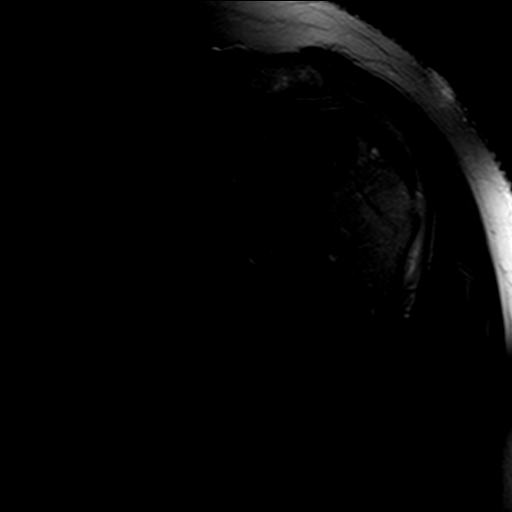
[im 18/18]
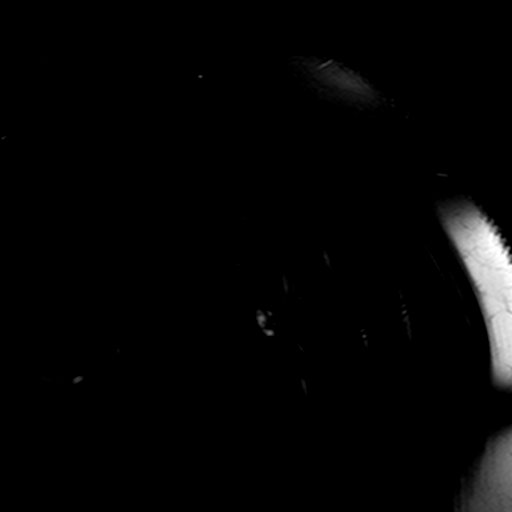

[Series 5: T1 · oblique · 4.0mm · 0.44mm/px · 3 of 22 slices shown]
[im 4/22]
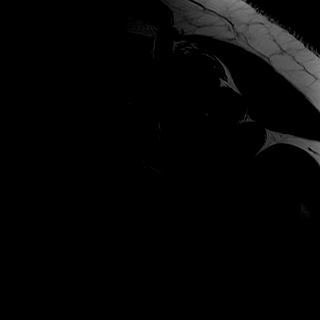
[im 11/22]
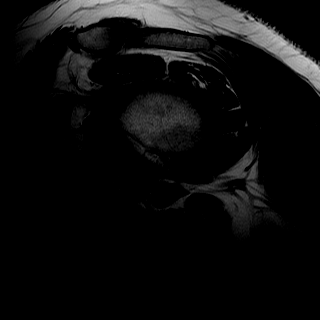
[im 18/22]
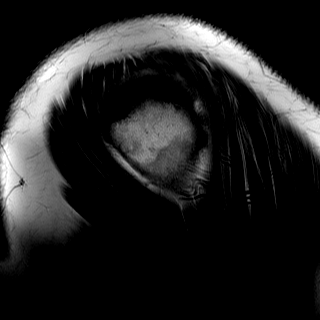

[Series 6: STIR · oblique · 4.0mm · 0.27mm/px · 3 of 22 slices shown]
[im 4/22]
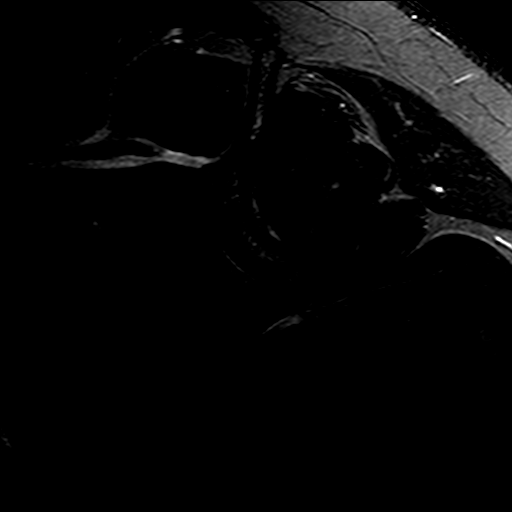
[im 11/22]
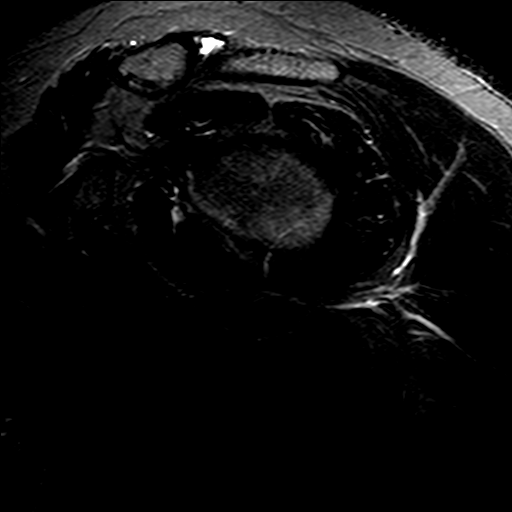
[im 18/22]
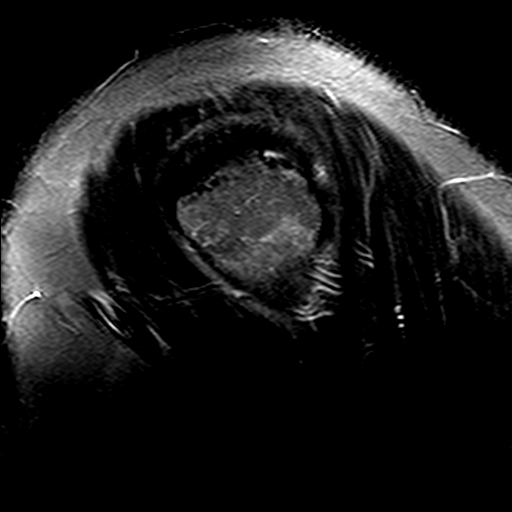

[18 of 40 positions shown; findings below may reference images not displayed]

------------- REPORT [id number] -------------
EXAM

MR shoulder LT wo con

INDICATION

Left shoulder pain

TECHNIQUE

MR of the left shoulder was performed without intravenous contrast. Multiplanar, multisequence
imaging was performed.

COMPARISONS

XR left shoulder 01/02/2019

FINDINGS

ROTATOR CUFF: Intact supraspinatus and infraspinatus tendons. Intact subscapularis tendon. Intact
teres minor tendon. Normal muscle bulk of the rotator cuff.

BICEPS TENDON: Intact extra-articular and intra-articular segments of the long head of the biceps
tendon. Minimal disproportionate focal area of fluid within the distal biceps tendon sheath in the
extra-articular segment (series 3, image 15).

GLENOHUMERAL JOINT: No definitive measurable cartilage defect. Evidence of prior superior labral
repair with a small anchor in place. No discrete labral tear on the provided images. Small amount of
fluid within the glenohumeral joint. Possible minimal posterior subluxation of the humeral head
relative to the glenoid (series 3, image 12). Query for minimal thickening of the axillary pouch
(series 7, image 11)

ACROMIOCLAVICULAR JOINT/SPACE:  Minimal amount of fluid at the superior aspect of the
acromioclavicular joint without significant joint space loss. Type II acromion. Intact
coracoclavicular and coracoacromial ligaments. No subacromial/subdeltoid bursal distention.

BONE: No acute fracture or aggressive focal osseous lesion

NEUROVASCULAR: Normal.

IMPRESSION
1. Query for minimal amount of disproportionate fluid within the biceps tendon sheath. Correlate
for biceps tenosynovitis versus adhesive capsulitis given that there is possible slight thickening
of the axillary pouch.
2. Query for minimal posterior subluxation of the humeral head relative to the glenoid, which may
be exaggerated secondary to the minimal amount of fluid within the anterior inferior glenohumeral
joint.
3. No discrete labral retear.

Tech Notes:

History of surgery approximately 1 year ago for labral tear.
Continued pain with no recent injury
BG

------------- REPORT [id number] -------------
EXAM

MR shoulder LT wo con

INDICATION

Left shoulder pain

TECHNIQUE

MR of the left shoulder was performed without intravenous contrast. Multiplanar, multisequence
imaging was performed.

COMPARISONS

XR left shoulder 01/02/2019

FINDINGS

ROTATOR CUFF: Intact supraspinatus and infraspinatus tendons. Intact subscapularis tendon. Intact
teres minor tendon. Normal muscle bulk of the rotator cuff.

BICEPS TENDON: Intact extra-articular and intra-articular segments of the long head of the biceps
tendon. Minimal disproportionate focal area of fluid within the distal biceps tendon sheath in the
extra-articular segment (series 3, image 15).

GLENOHUMERAL JOINT: No definitive measurable cartilage defect. Evidence of prior superior labral
repair with a small anchor in place. No discrete labral tear on the provided images. Small amount of
fluid within the glenohumeral joint. Possible minimal posterior subluxation of the humeral head
relative to the glenoid (series 3, image 12). Query for minimal thickening of the axillary pouch
(series 7, image 11)

ACROMIOCLAVICULAR JOINT/SPACE:  Minimal amount of fluid at the superior aspect of the
acromioclavicular joint without significant joint space loss. Type II acromion. Intact
coracoclavicular and coracoacromial ligaments. No subacromial/subdeltoid bursal distention.

BONE: No acute fracture or aggressive focal osseous lesion

NEUROVASCULAR: Normal.

IMPRESSION
1. Query for minimal amount of disproportionate fluid within the biceps tendon sheath. Correlate
for biceps tenosynovitis versus adhesive capsulitis given that there is possible slight thickening
of the axillary pouch.
2. Query for minimal posterior subluxation of the humeral head relative to the glenoid, which may
be exaggerated secondary to the minimal amount of fluid within the anterior inferior glenohumeral
joint.
3. No discrete labral retear.

Tech Notes:

History of surgery approximately 1 year ago for labral tear.
Continued pain with no recent injury
BG

## 2019-10-16 IMAGING — CR CHEST
1 series · 1 of 1 positions shown · non-contrast
Comparison: none

[chest ap grid]
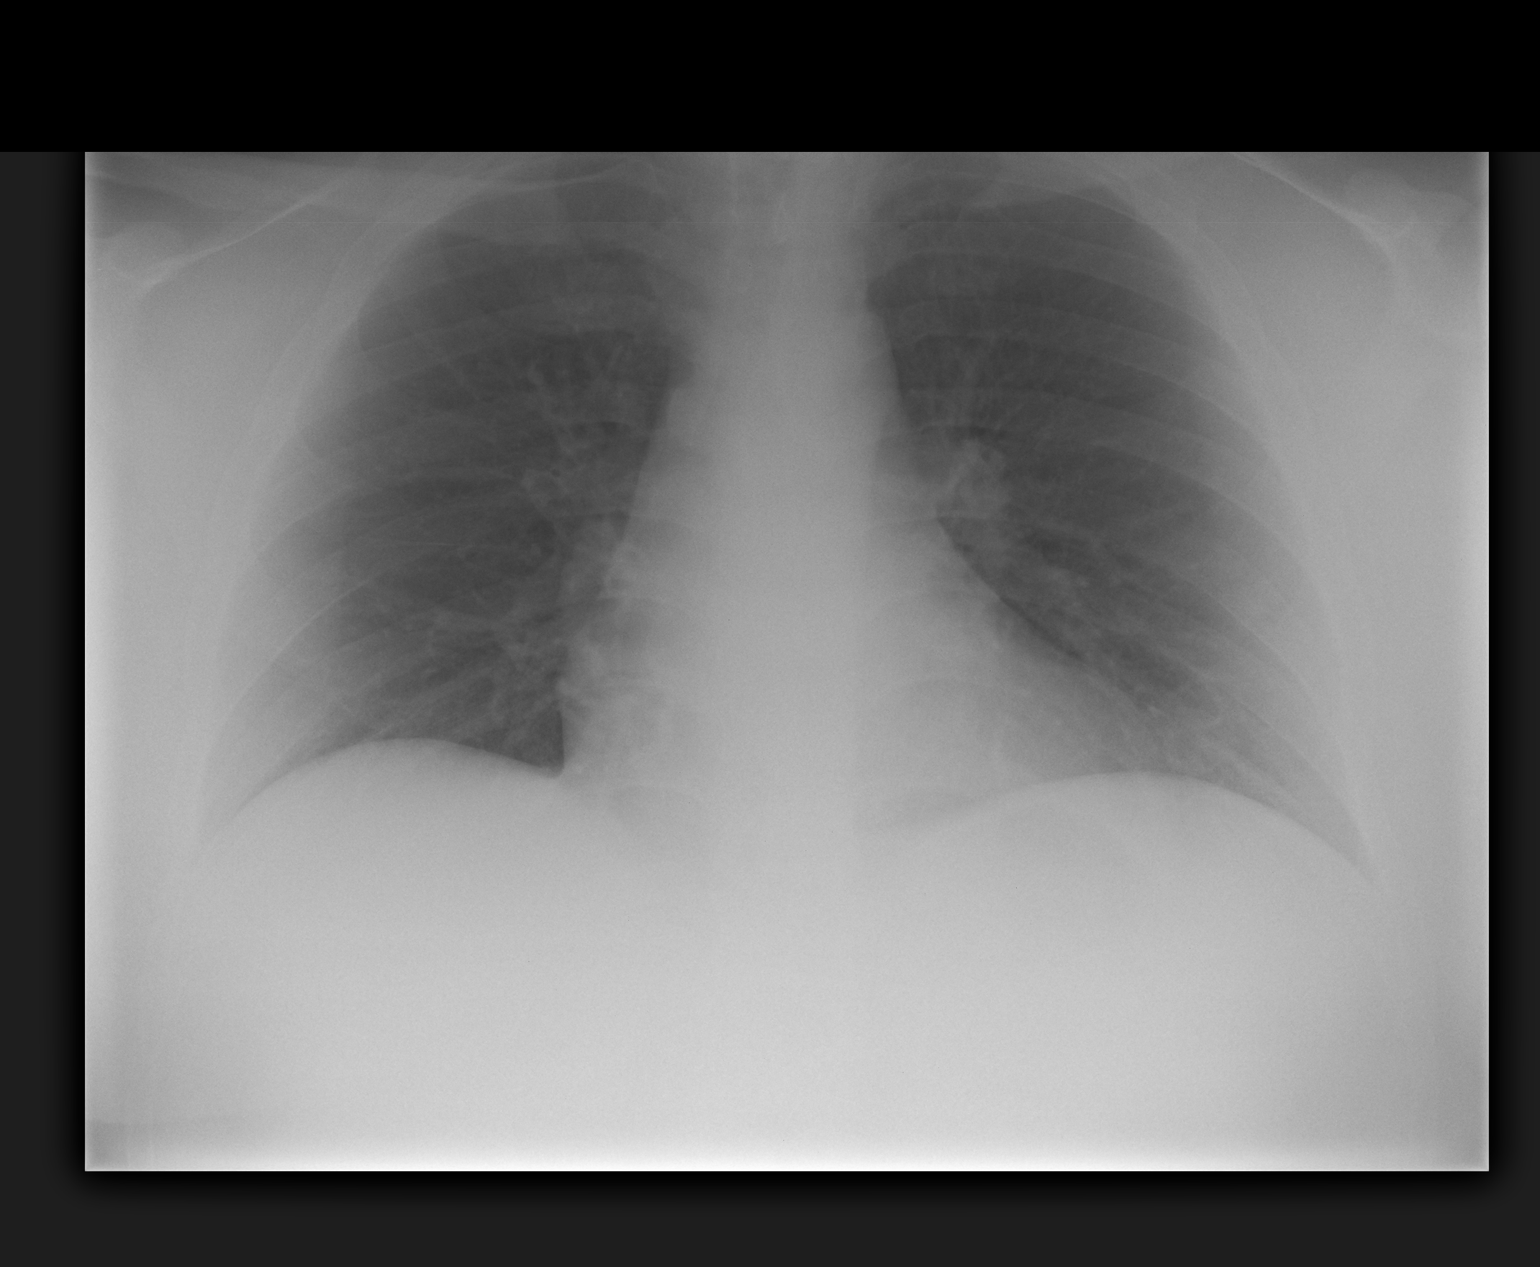

[1 of 1 positions shown; findings below may reference images not displayed]

------------- REPORT [id number] -------------
EXAM
Chest radiograph

INDICATION
cough
Pt c/o cough x 4 days. H/o sports induced asthma. Pt states "smokes every once in awhile." Pt
shielded. CC

TECHNIQUE
AP view chest

COMPARISONS
09/08/2019

FINDINGS
No focal consolidation or pleural effusion. Normal heart size. No acute osseous abnormality.

IMPRESSION
No acute cardiopulmonary abnormality.

Tech Notes:

Pt c/o cough x 4 days. H/o sports induced asthma. Pt states "smokes every once in awhile." Pt
shielded. CC

------------- REPORT [id number] -------------
EXAM
Chest radiograph

INDICATION
cough
Pt c/o cough x 4 days. H/o sports induced asthma. Pt states "smokes every once in awhile." Pt
shielded. CC

TECHNIQUE
AP view chest

COMPARISONS
09/08/2019

FINDINGS
No focal consolidation or pleural effusion. Normal heart size. No acute osseous abnormality.

IMPRESSION
No acute cardiopulmonary abnormality.

Tech Notes:

Pt c/o cough x 4 days. H/o sports induced asthma. Pt states "smokes every once in awhile." Pt
shielded. CC

## 2019-10-16 IMAGING — CT BRAIN WO(Adult)
3 of 4 series · 14 of 47 positions shown, 16 images · non-contrast
Comparison: none

[Series 4: brain cor 5.00 hr40 s3 · coronal · 0.31mm/px · 3 of 36 slices shown]
[im 12/36  brain]
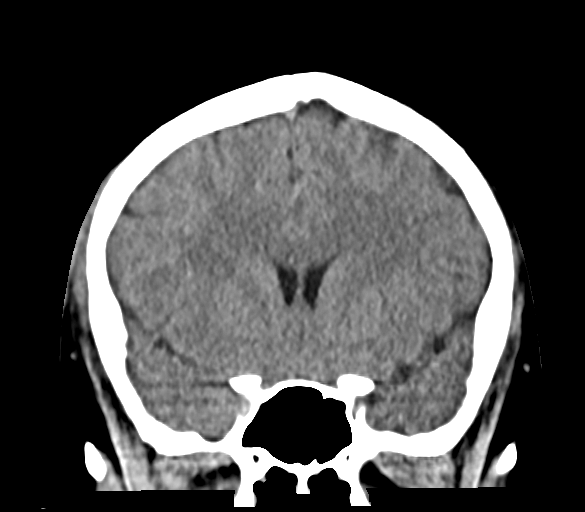
[im 16/36  brain]
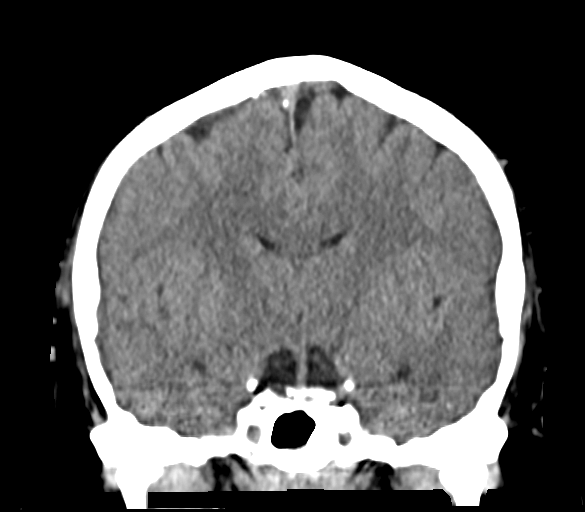
[im 20/36  brain]
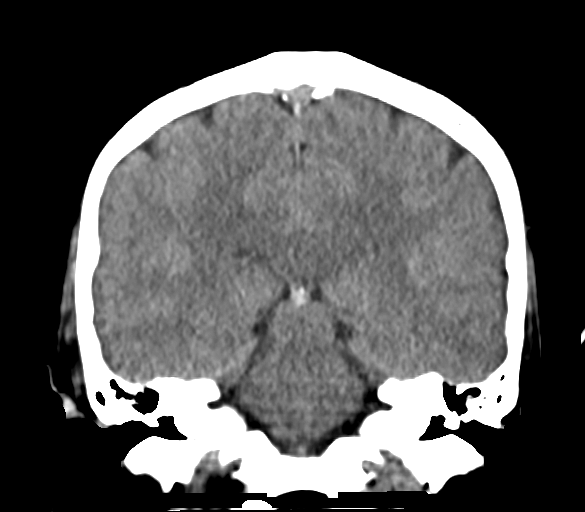

[Series 6: brain sag 5.00 hr40 s3 · sagittal · 0.31mm/px · 3 of 33 slices shown]
[im 11/33  brain]
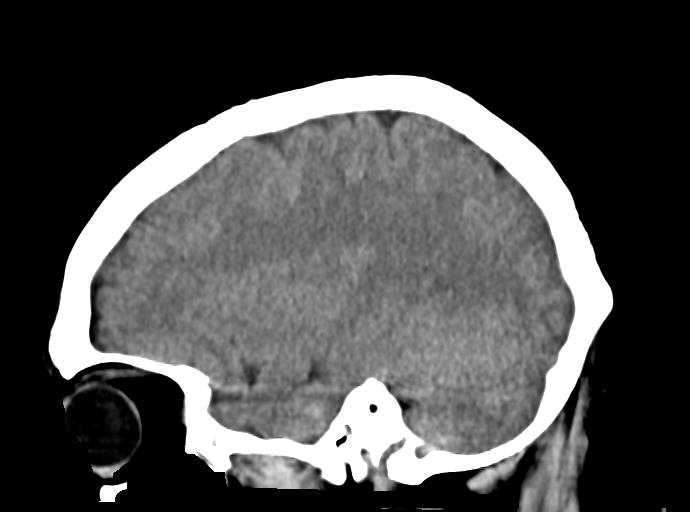
[im 17/33  brain]
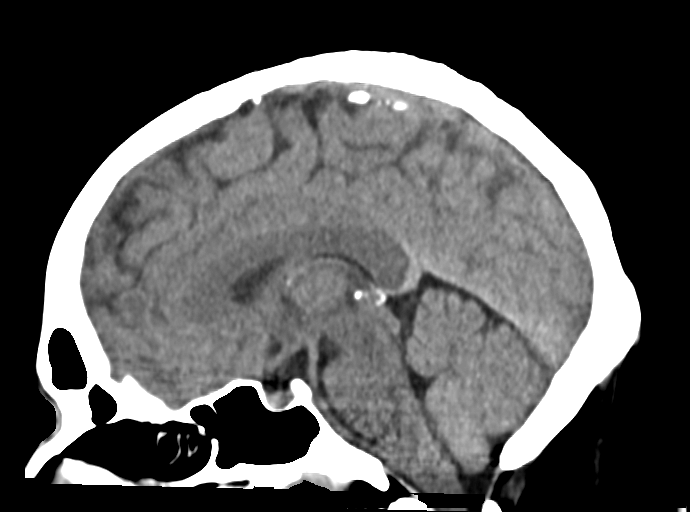
[im 22/33  brain]
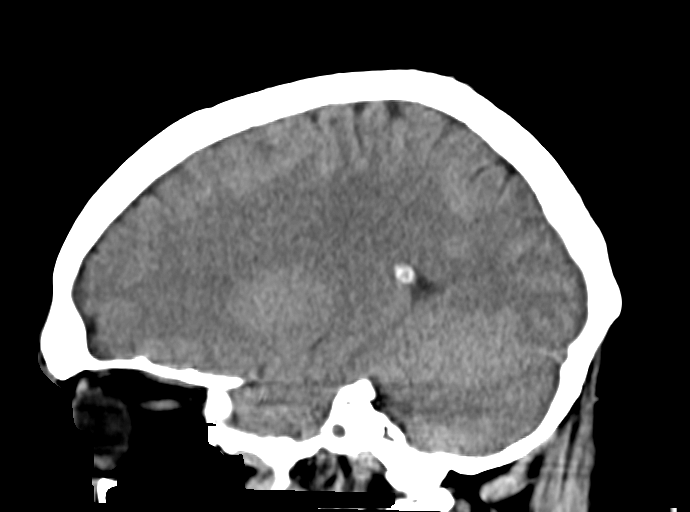

[Series 8: brain ax 2.00 hr60 s3 · axial · 0.36mm/px · z∈[-473,-348]mm · 8 of 76 slices shown, 10 images]
[im 8/76  brain]
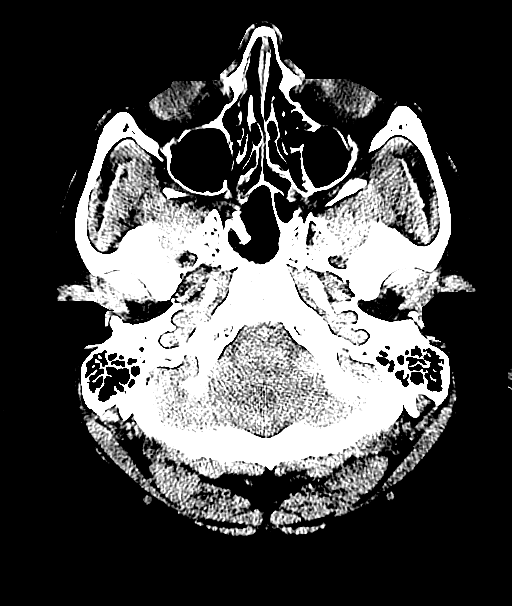
[im 8/76  bone]
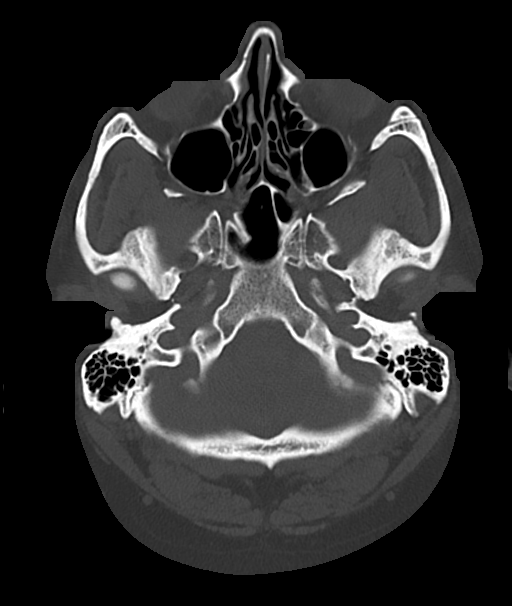
[im 15/76  brain]
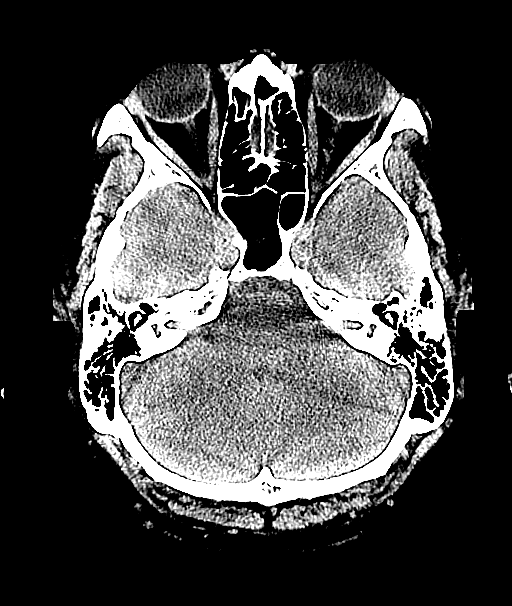
[im 26/76  brain]
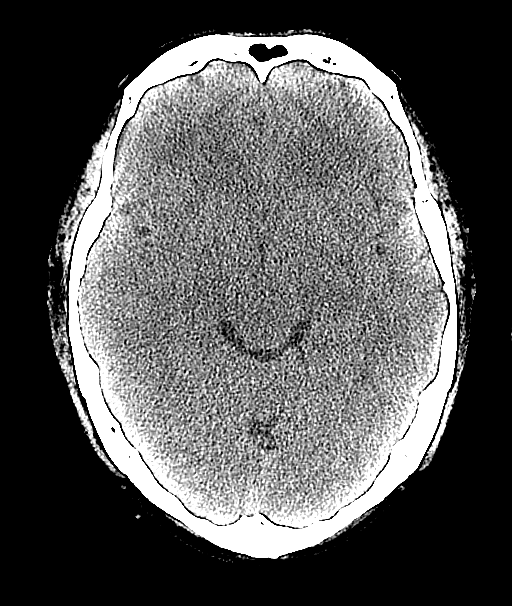
[im 33/76  brain]
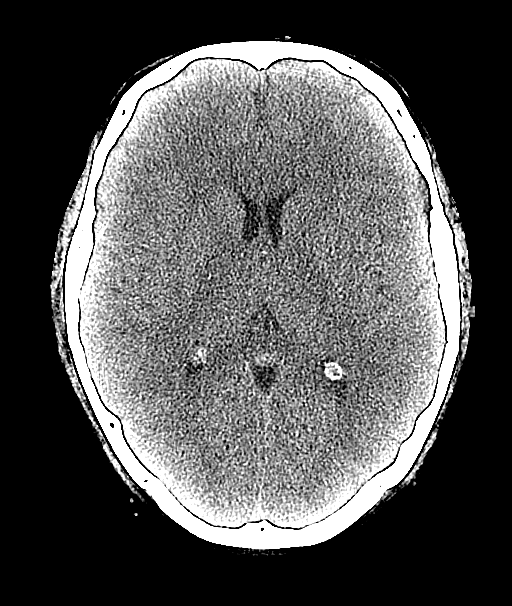
[im 43/76  brain]
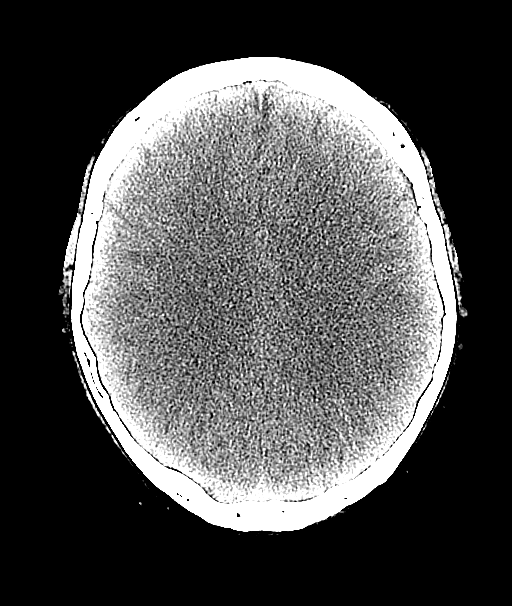
[im 43/76  bone]
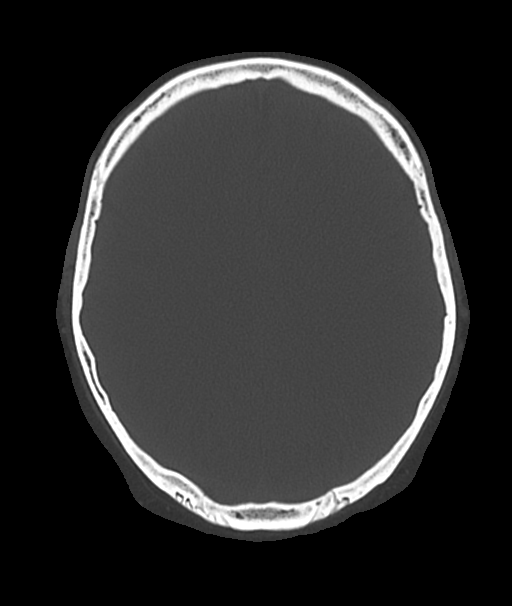
[im 51/76  brain]
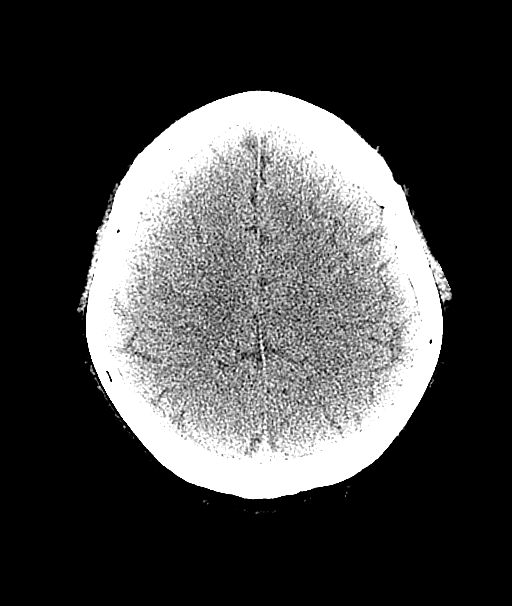
[im 61/76  brain]
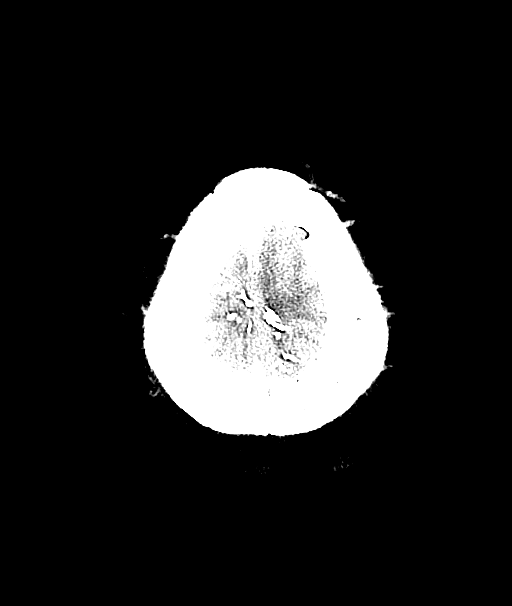
[im 68/76  brain]
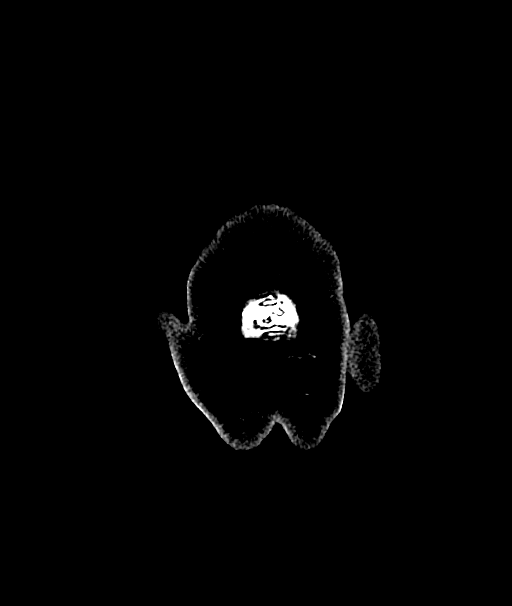

[14 of 47 positions shown; findings below may reference images not displayed]

------------- REPORT [id number] -------------
All CT scans at this facility use dose modulation, iterative reconstruction, and/or weight based
dosing when appropriate to reduce radiation dose to as low as reasonably achievable.
Number of CT scans performed in the last 12 months: 1
Number of myocardial perfusion scans performed in the last 12 months: 0

EXAM
CT head without contrast

INDICATION
severe headache

TECHNIQUE
CT head without contrast

COMPARISONS
February 10, 2020

FINDINGS
No acute intracranial hemorrhage or mass effect. The ventricles and sulci are normal in size. The
basilar cisterns are patent. Gray-white differentiation well preserved. Visualized orbits, paranasal
sinuses, mastoid air cells, skull base, and calvarium are unremarkable.

IMPRESSION
No acute intracranial abnormality.

Tech Notes:

------------- REPORT [id number] -------------
All CT scans at this facility use dose modulation, iterative reconstruction, and/or weight based
dosing when appropriate to reduce radiation dose to as low as reasonably achievable.
Number of CT scans performed in the last 12 months: 1
Number of myocardial perfusion scans performed in the last 12 months: 0

EXAM
CT head without contrast

INDICATION
severe headache

TECHNIQUE
CT head without contrast

COMPARISONS
February 10, 2020

FINDINGS
No acute intracranial hemorrhage or mass effect. The ventricles and sulci are normal in size. The
basilar cisterns are patent. Gray-white differentiation well preserved. Visualized orbits, paranasal
sinuses, mastoid air cells, skull base, and calvarium are unremarkable.

IMPRESSION
No acute intracranial abnormality.

Tech Notes:

## 2019-11-28 IMAGING — CT FOOT_ANKLE(Adult)
1 of 2 series · 10 of 14 positions shown, 13 images · non-contrast
Comparison: none

[Series 8: foot or ankle 0.80 br40 s3 tumbler · axial · 0.29mm/px · z∈[+596,+678]mm · 10 of 14 slices shown, 13 images]
[im 2/14  soft-tissue]
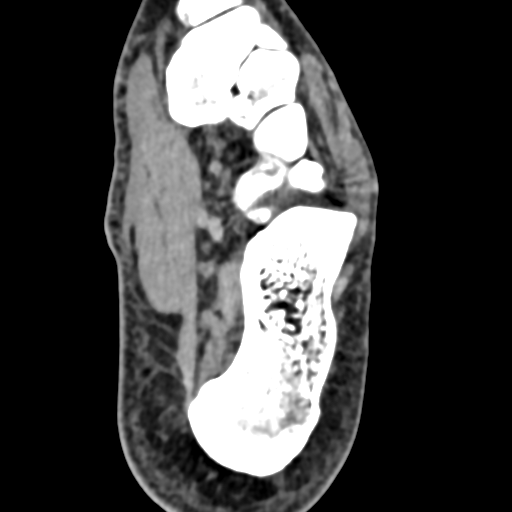
[im 2/14  bone]
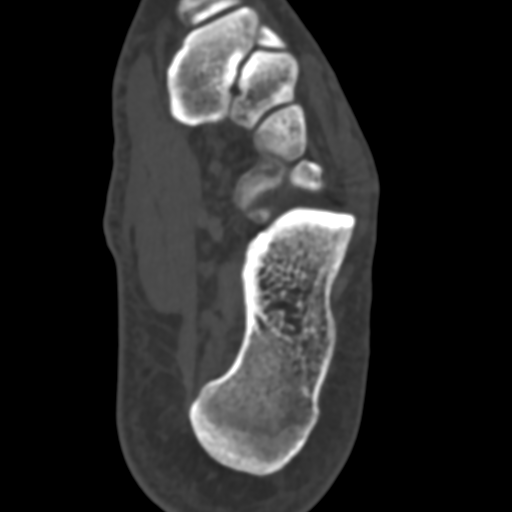
[im 3/14  bone]
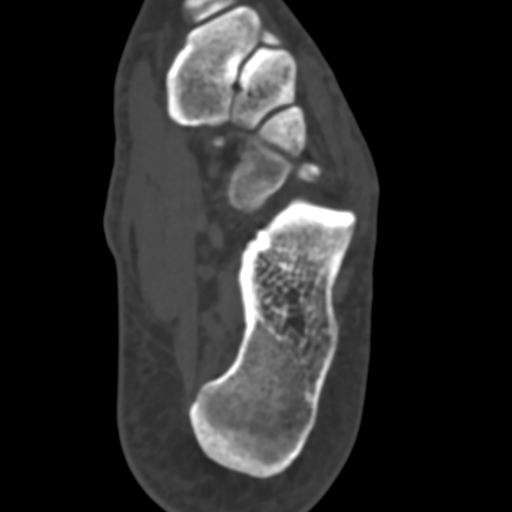
[im 4/14  bone]
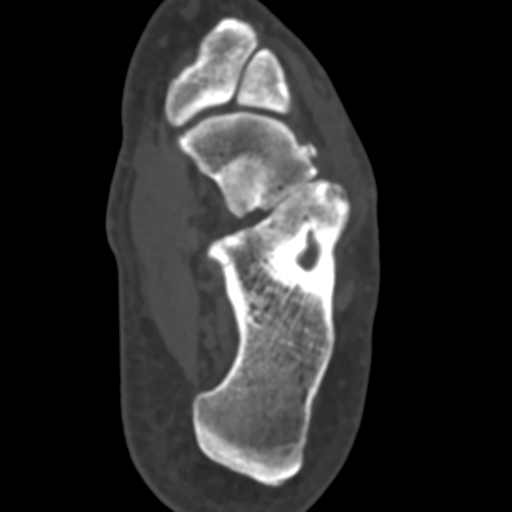
[im 5/14  bone]
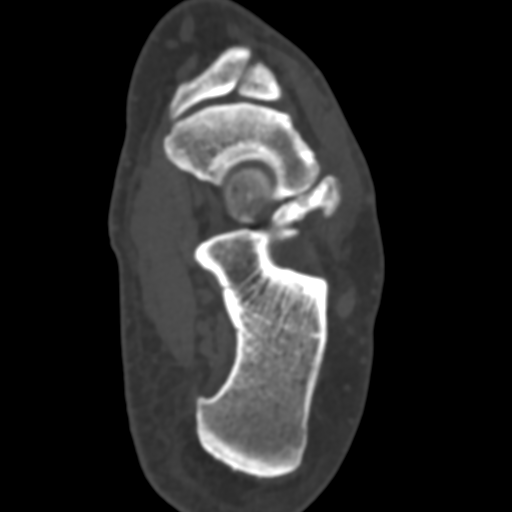
[im 6/14  soft-tissue]
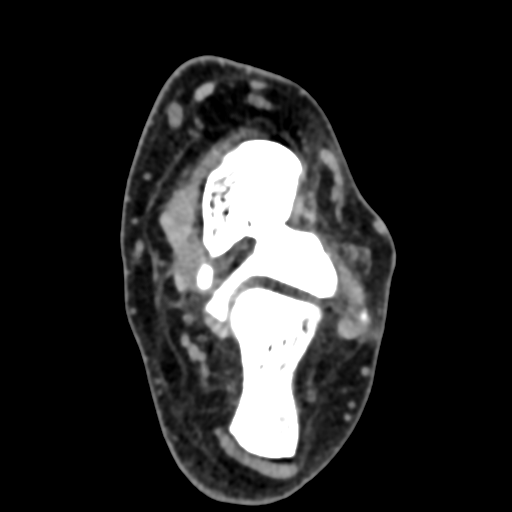
[im 6/14  bone]
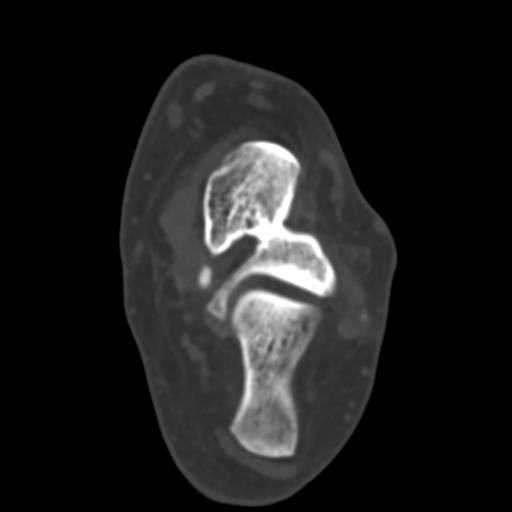
[im 8/14  bone]
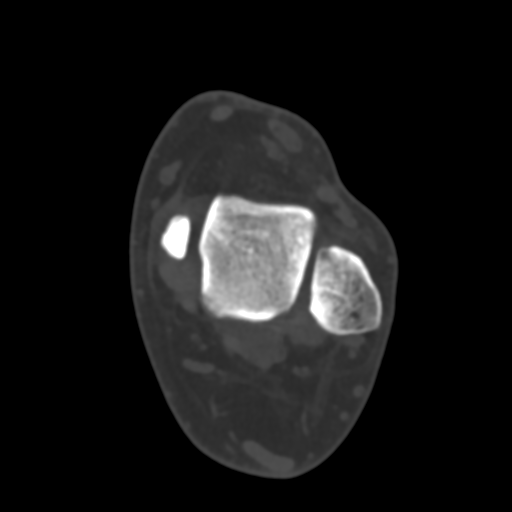
[im 9/14  bone]
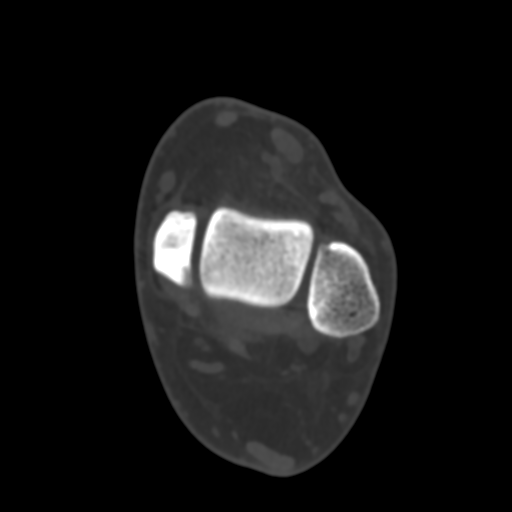
[im 10/14  bone]
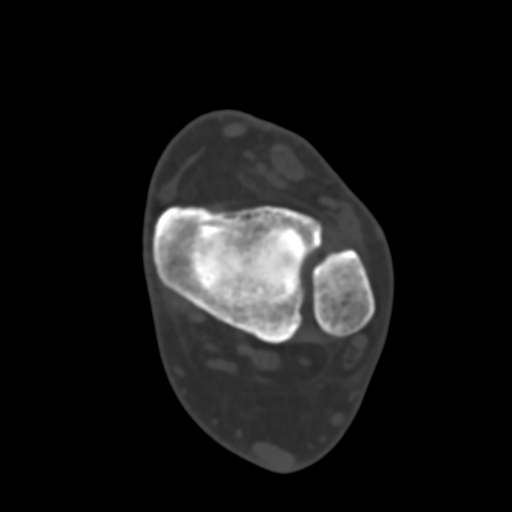
[im 11/14  soft-tissue]
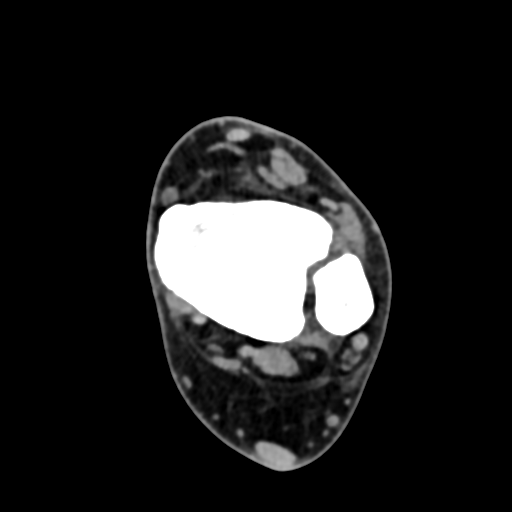
[im 11/14  bone]
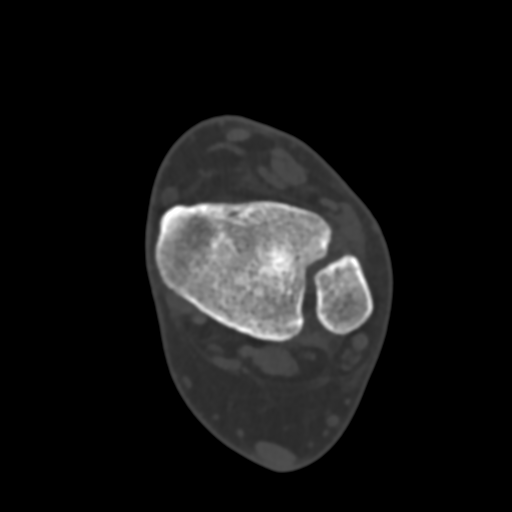
[im 12/14  bone]
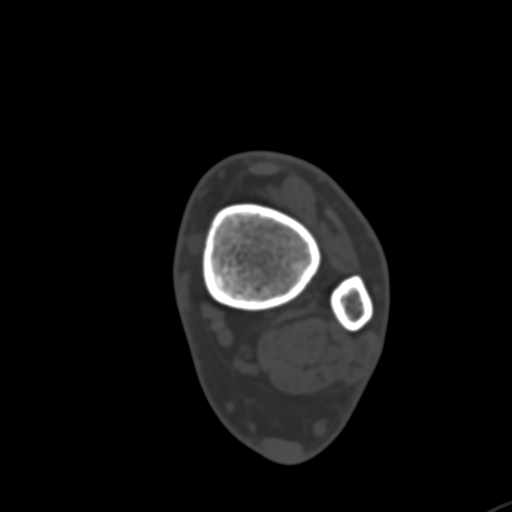

[10 of 14 positions shown; findings below may reference images not displayed]

All CT scans at this facility use dose modulation, iterative reconstruction, and/or weight based
dosing when appropriate to reduce radiation dose to as low as reasonably achievable.

[No] previous CT scans or nuclear medicine cardiac perfusion studies have been performed in the
last 12 months.

EXAM

Noncontrast CT of the left ankle

INDICATION

acute injury  see plain film report
C/O ANKLE PAIN AFTER FALL TODAY. ? ABNORMAL BONE DENSITY NEAR TALUS ON SAME DAY CXR. HB

FINDINGS

There is mild soft tissue swelling. The bone density is maintained. There is no destruction.

There is no displaced fracture dislocation

A large os trigonum is identified, this may have been what was visualized on the oblique diagnostic
image.

IMPRESSION

No displaced fractures identified.

Tech Notes:

C/O ANKLE PAIN AFTER FALL TODAY. ? ABNORMAL BONE DENSITY NEAR TALUS ON SAME DAY CXR. HB

## 2019-12-15 IMAGING — CR LOW_EXM
1 series · 1 of 1 positions shown · non-contrast
Comparison: No relevant prior studies available.
COMPARISON: No relevant prior studies available.

------------- REPORT [id number] -------------
DIAGNOSTIC STUDIES

EXAM:  XR LEFT ANKLE COMPLETE, 3 OR MORE VIEWS  ([id number])
INDICATION: fall , pain Pt states he fell down 7 flights of stairs on 01/21/22 and twisted his left
ankle. He hit his left ankle again today. Pain along lateral aspect of left ankle. Shielded. ME ATH

[ankle ap]
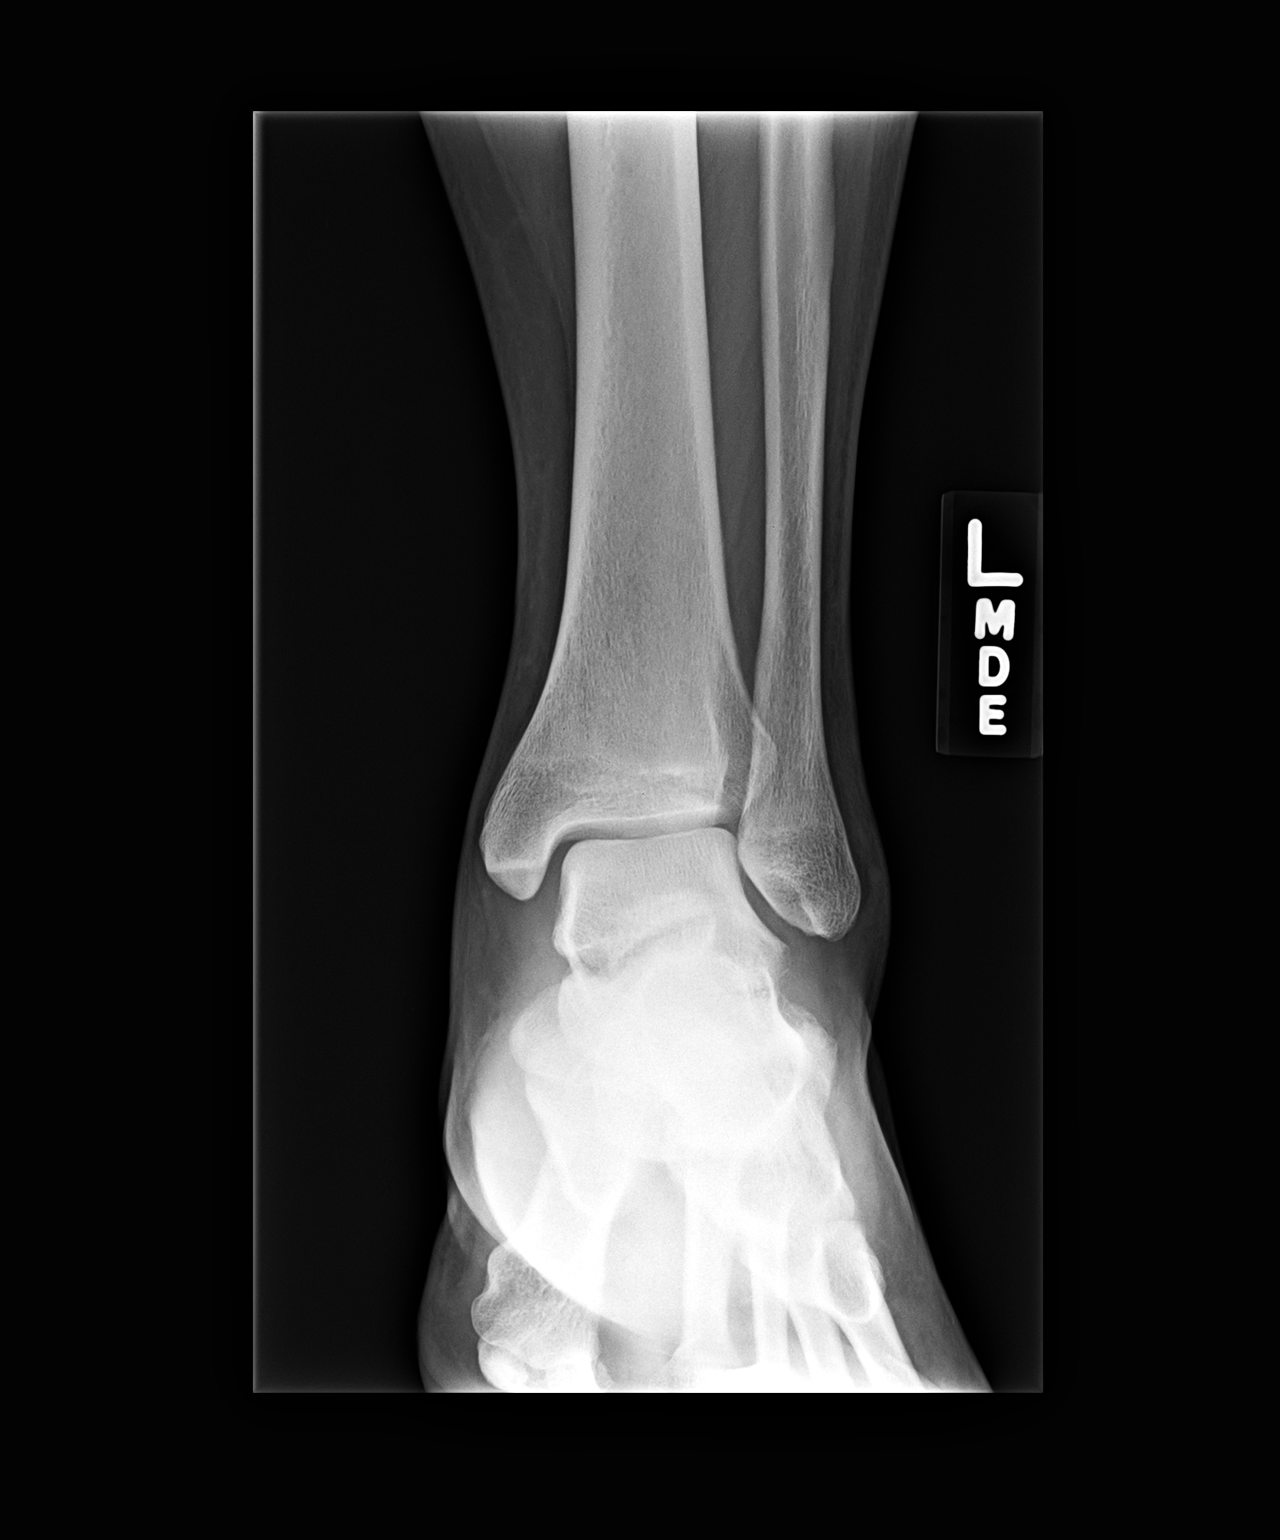

[1 of 1 positions shown; findings below may reference images not displayed]

FINDINGS: BONES/JOINTS:  NO definite fracture or dislocation.

SOFT TISSUES:  No radiopaque foreign body.
IMPRESSION: - There is NO evidence of acute fractures or dislocations.

Tech Notes:

Pt states he fell down 7 flights of stairs on 01/21/22 and twisted his left ankle. He hit his left
ankle again today. Pain along lateral aspect of left ankle. Shielded. ME

------------- REPORT [id number] -------------
DIAGNOSTIC STUDIES

EXAM:  XR LEFT ANKLE COMPLETE, 3 OR MORE VIEWS  ([id number])
FINDINGS: BONES/JOINTS:  NO definite fracture or dislocation.

SOFT TISSUES:  No radiopaque foreign body.
IMPRESSION: - There is NO evidence of acute fractures or dislocations.

Tech Notes:

Pt states he fell down 7 flights of stairs on 01/21/22 and twisted his left ankle. He hit his left
ankle again today. Pain along lateral aspect of left ankle. Shielded. ME

## 2019-12-18 IMAGING — MR Ankle^Routine
6 of 8 series · 28 of 40 positions shown · non-contrast
Comparison: none

[Series 3: T1 · axial · 4.0mm · 0.59mm/px · z∈[-83,+37]mm · 5 of 25 slices shown (1 of 3)]
[im 1/25]
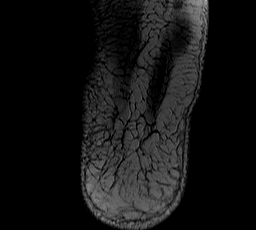
[im 7/25]
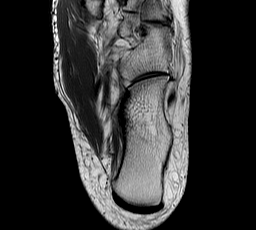
[im 13/25]
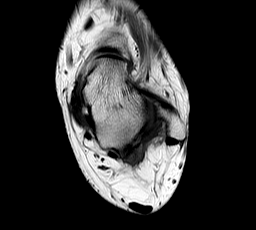
[im 19/25]
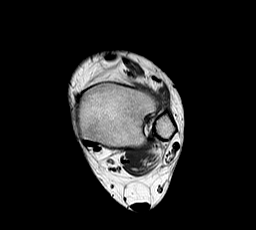
[im 25/25]
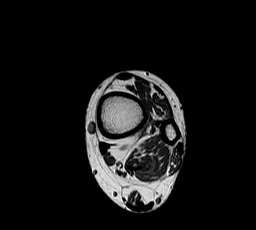

[Series 4: T2 fat-sat · axial · 3.0mm · 0.29mm/px · z∈[-67,+19]mm · 5 of 23 slices shown]
[im 1/23]
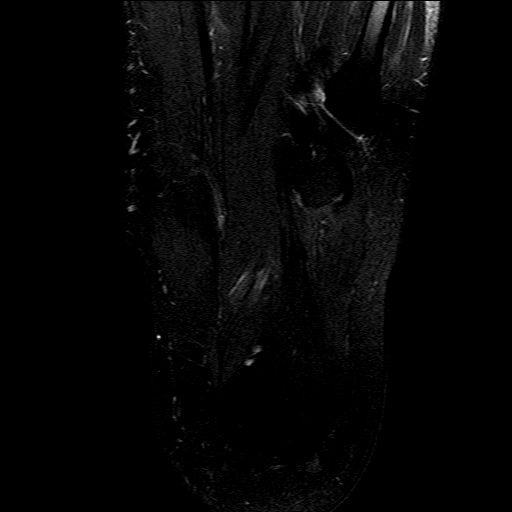
[im 6/23]
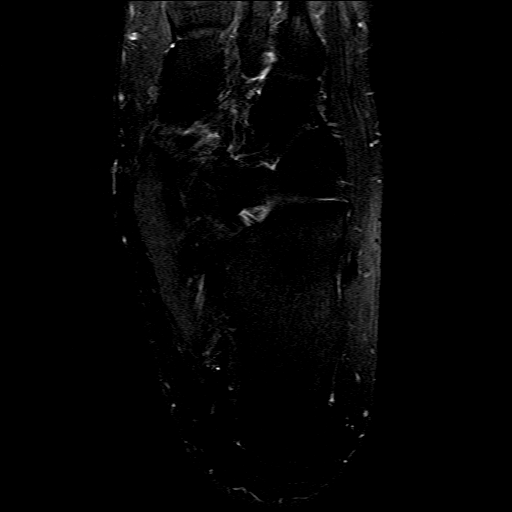
[im 12/23]
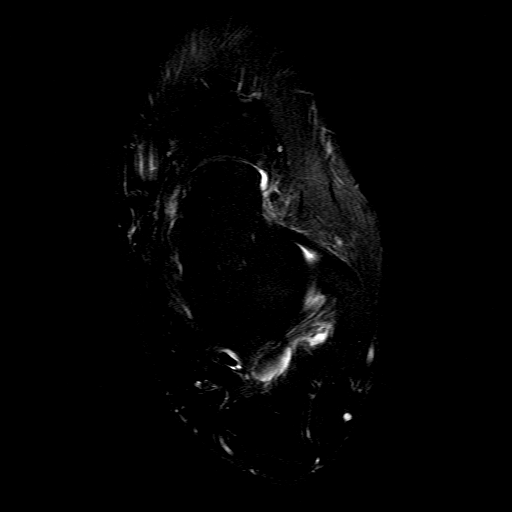
[im 17/23]
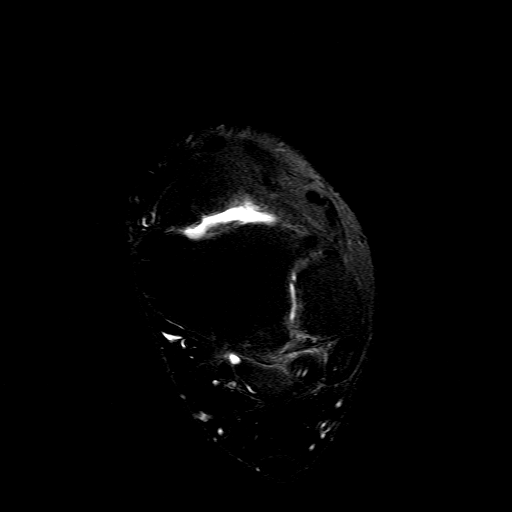
[im 23/23]
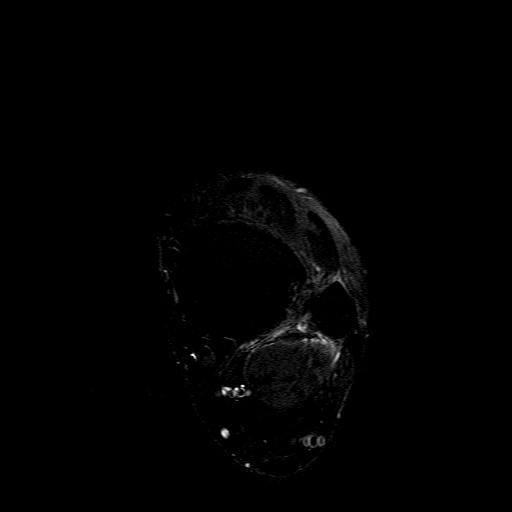

[Series 5: T1 · sagittal · 3.0mm · 0.47mm/px · 5 of 20 slices shown (2 of 3)]
[im 1/20]
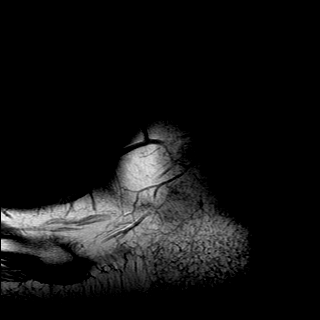
[im 5/20]
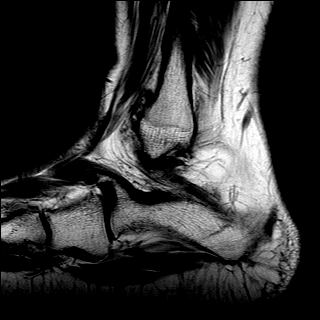
[im 10/20]
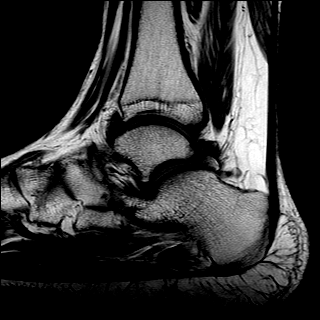
[im 15/20]
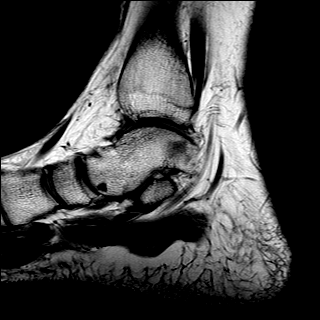
[im 20/20]
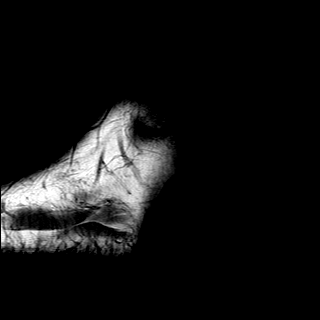

[Series 6: STIR · sagittal · 3.0mm · 0.29mm/px · 5 of 20 slices shown (1 of 2)]
[im 1/20]
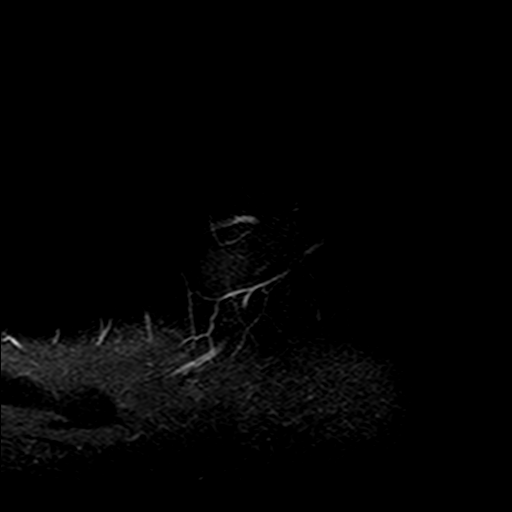
[im 5/20]
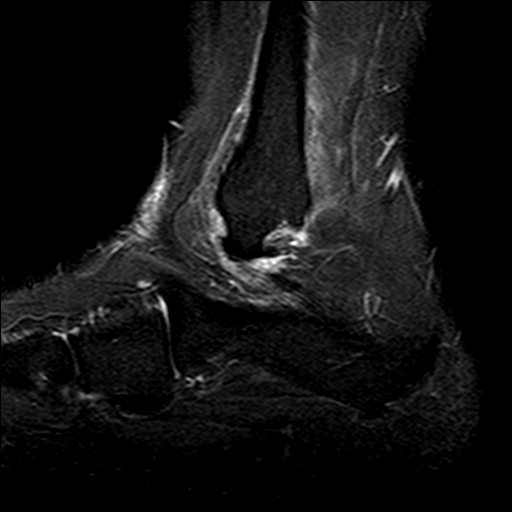
[im 10/20]
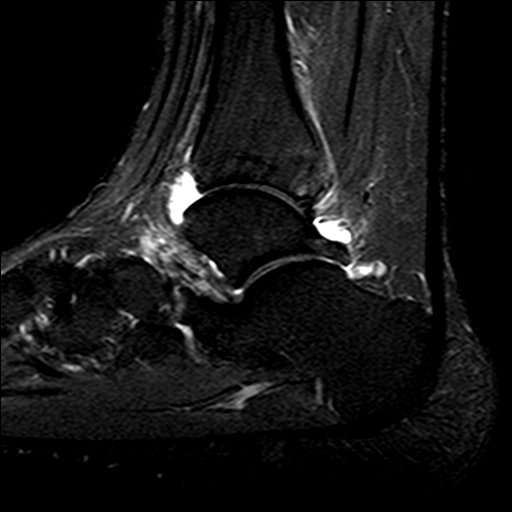
[im 15/20]
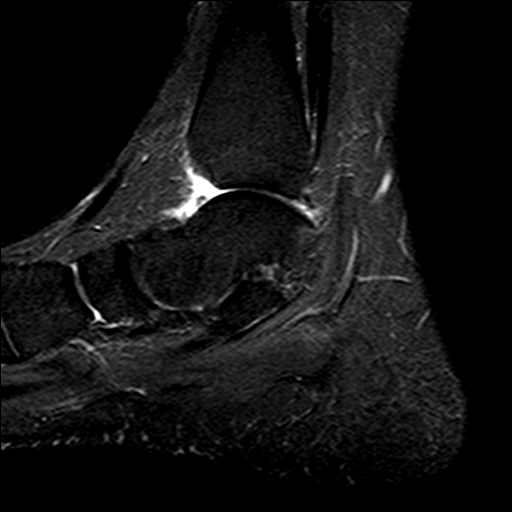
[im 20/20]
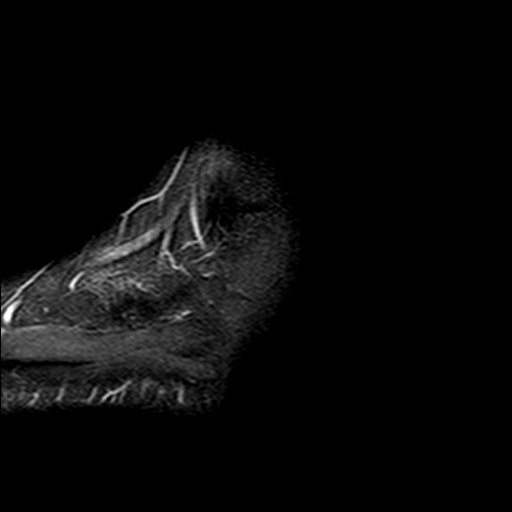

[Series 7: STIR · coronal · 3.0mm · 0.29mm/px · 3 of 20 slices shown (2 of 2)]
[im 1/20]
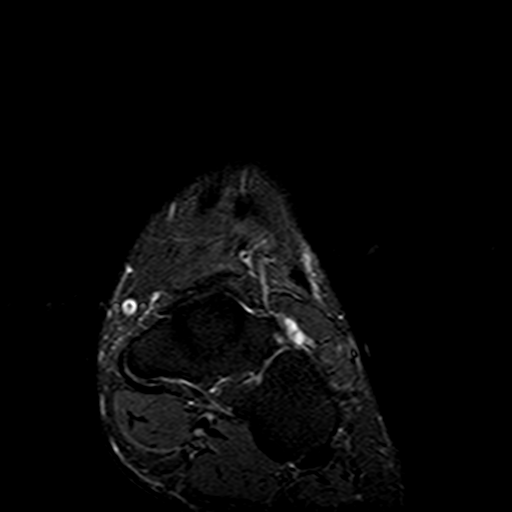
[im 5/20]
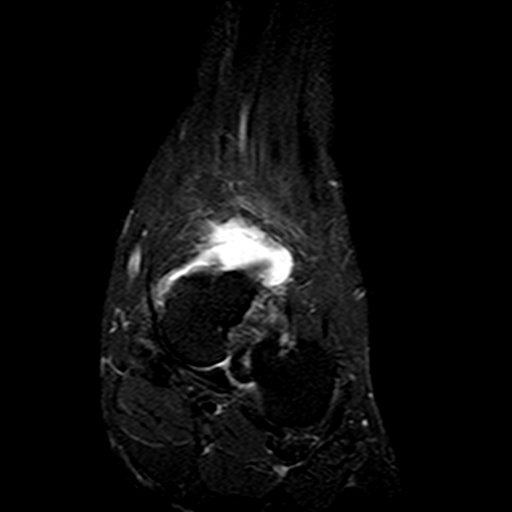
[im 10/20]
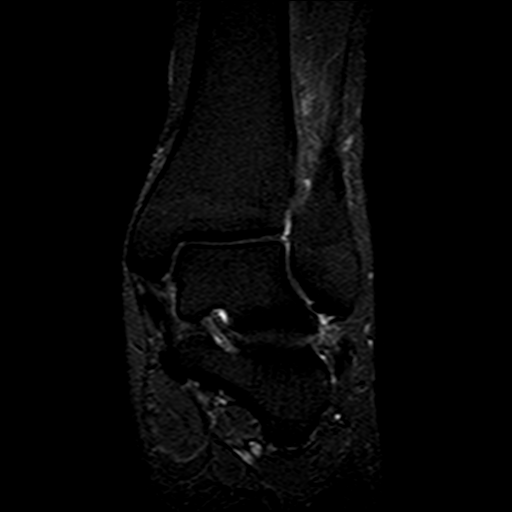

[Series 8: T1 · coronal · 4.0mm · 0.47mm/px · 5 of 20 slices shown (3 of 3)]
[im 1/20]
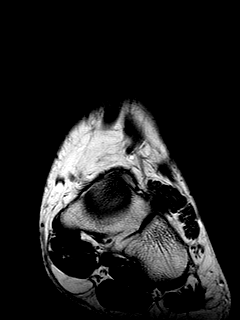
[im 5/20]
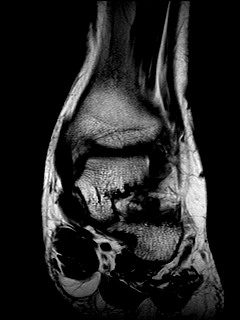
[im 10/20]
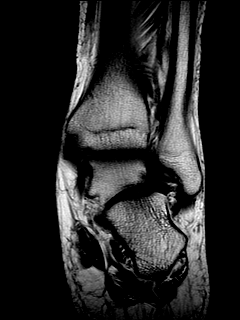
[im 15/20]
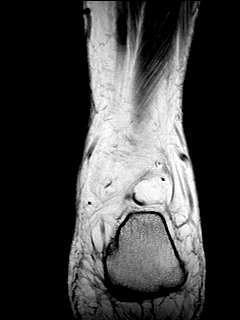
[im 20/20]
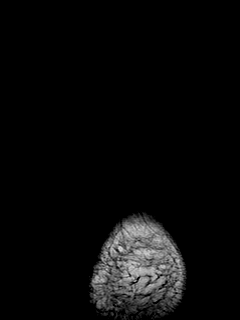

[28 of 40 positions shown; findings below may reference images not displayed]

------------- REPORT [id number] -------------
DIAGNOSTIC STUDIES

EXAM

MRI left ankle without contrast.

INDICATION

left ankle and achilles pain
LEFT ANKLE PAIN, ANTERIOR AND POSTERIOR, AFTER TWISTING AT WORK.  RG

TECHNIQUE

Sagittal, axial, and coronal images were obtained with variable T1 and T2 weighting.

COMPARISONS

None available

FINDINGS

The Achilles tendon as well as the flexor and extensor tendons are normal. The peroneal tendons are
normal.

There is mild muscular edema within the flexor hallucis longus.

The anterior and posterior talofibular ligaments are within normal limits. The posterior inferior
tibiotalar ligament is normal. There is a tear of the anterior inferior tibiotalar ligament with
mild edema in the interosseous membrane.

Small ankle effusion is noted.

Osseous structures demonstrate normal signal intensity.

IMPRESSION

Tear of the anterior inferior tibiotalar talar ligament and mild edema in the interosseous
membrane.

Small ankle effusion.

Mild edema within the flexor hallucis muscle consistent with muscular strain or partial tear.

Tech Notes:

LEFT ANKLE PAIN, ANTERIOR AND POSTERIOR, AFTER TWISTING AT WORK.  RG

------------- REPORT [id number] -------------
DIAGNOSTIC STUDIES

EXAM

MRI left ankle without contrast.

INDICATION

left ankle and achilles pain
LEFT ANKLE PAIN, ANTERIOR AND POSTERIOR, AFTER TWISTING AT WORK.  RG

TECHNIQUE

Sagittal, axial, and coronal images were obtained with variable T1 and T2 weighting.

COMPARISONS

None available

FINDINGS

The Achilles tendon as well as the flexor and extensor tendons are normal. The peroneal tendons are
normal.

There is mild muscular edema within the flexor hallucis longus.

The anterior and posterior talofibular ligaments are within normal limits. The posterior inferior
tibiotalar ligament is normal. There is a tear of the anterior inferior tibiotalar ligament with
mild edema in the interosseous membrane.

Small ankle effusion is noted.

Osseous structures demonstrate normal signal intensity.

IMPRESSION

Tear of the anterior inferior tibiotalar talar ligament and mild edema in the interosseous
membrane.

Small ankle effusion.

Mild edema within the flexor hallucis muscle consistent with muscular strain or partial tear.

Tech Notes:

LEFT ANKLE PAIN, ANTERIOR AND POSTERIOR, AFTER TWISTING AT WORK.  RG

## 2020-03-05 IMAGING — CT ABDOMEN_PELVIS W(Adult)
2 of 3 series · 11 of 46 positions shown, 12 images · IV contrast (Omnipaque)
Comparison: No relevant prior studies available.
COMPARISON: No relevant prior studies available.

------------- REPORT [id number] -------------
DIAGNOSTIC STUDIES

EXAM:  CT ABDOMEN AND PELVIS WITH INTRAVENOUS CONTRAST  ([id number])
INDICATION: abdominal pain Pt c/o abdomen pain x 4 days. H/o chole and appy. WTRE4FC 7MON9 used.
CT/NM 2/0. CF
TECHNIQUE: Axial computed tomography images of the abdomen and pelvis with intravenous contrast.
Sagittal and coronal reformatted images were created and reviewed.

[Series 2: abdomen_pelvis ax 3.00 br40 s3 · axial · 0.69mm/px · z∈[+1204,+1657]mm · 8 of 171 slices shown, 9 images]
[im 11/171  soft-tissue]
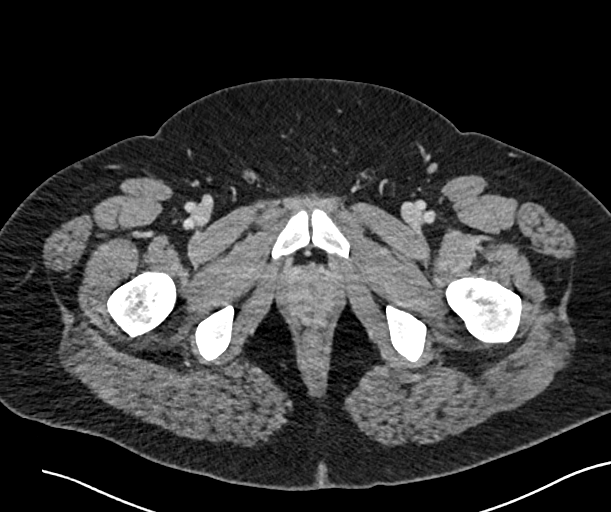
[im 11/171  bone]
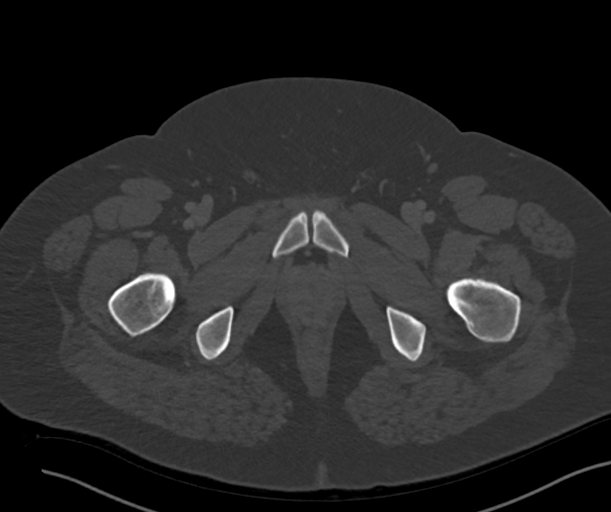
[im 33/171  soft-tissue]
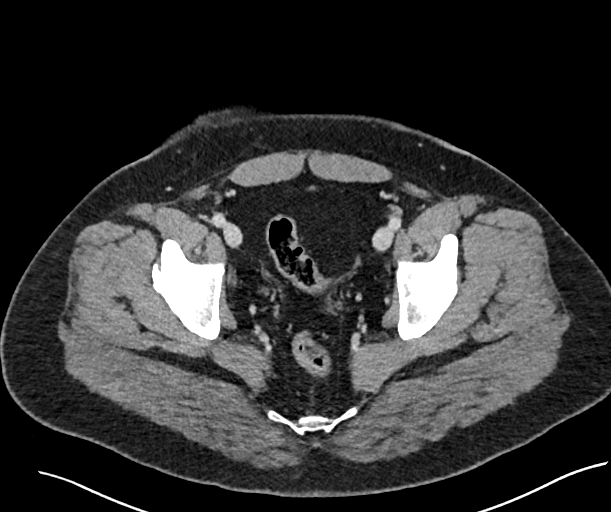
[im 55/171  soft-tissue]
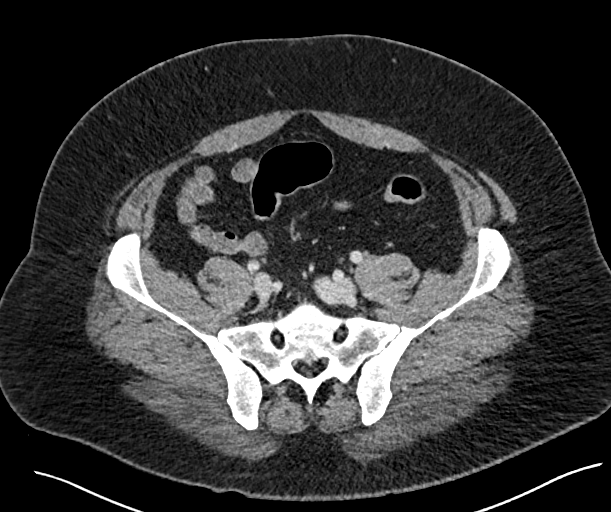
[im 77/171  soft-tissue]
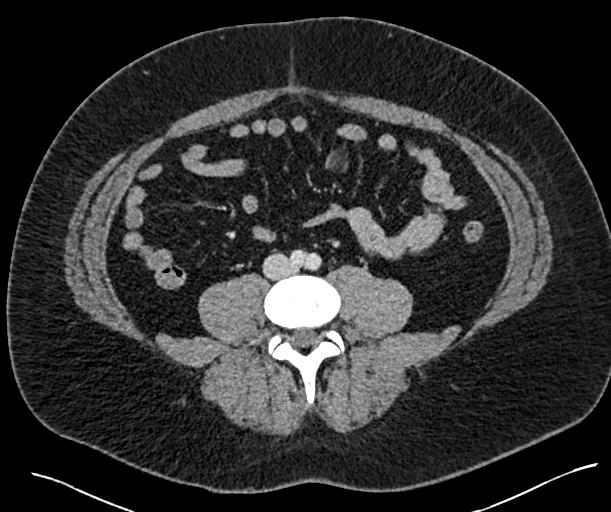
[im 94/171  soft-tissue]
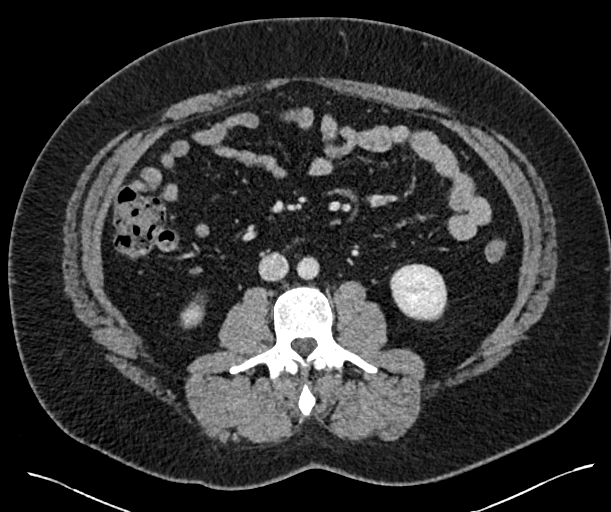
[im 116/171  soft-tissue]
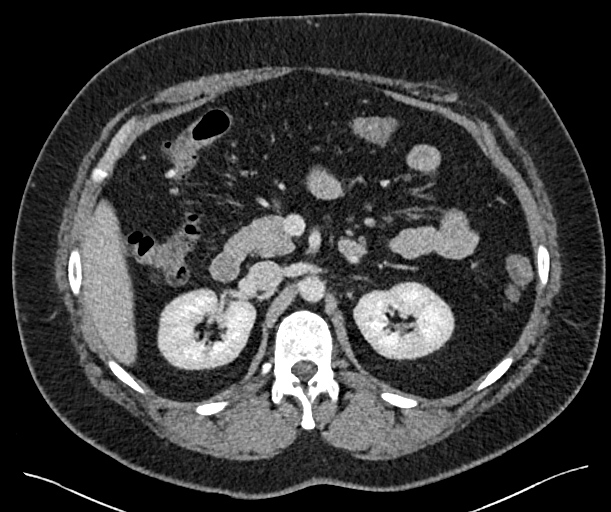
[im 138/171  soft-tissue]
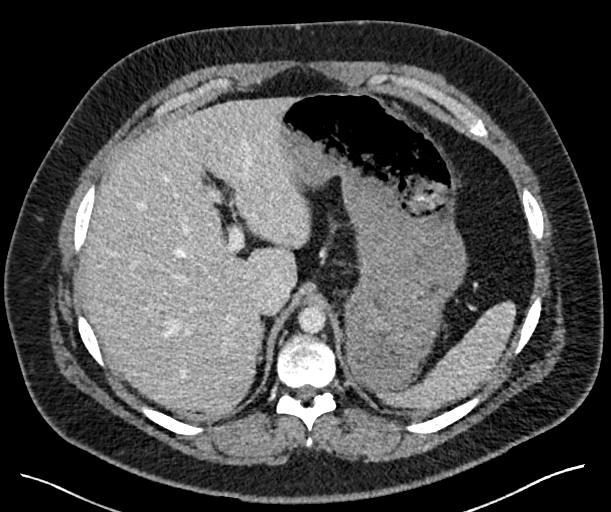
[im 160/171  soft-tissue]
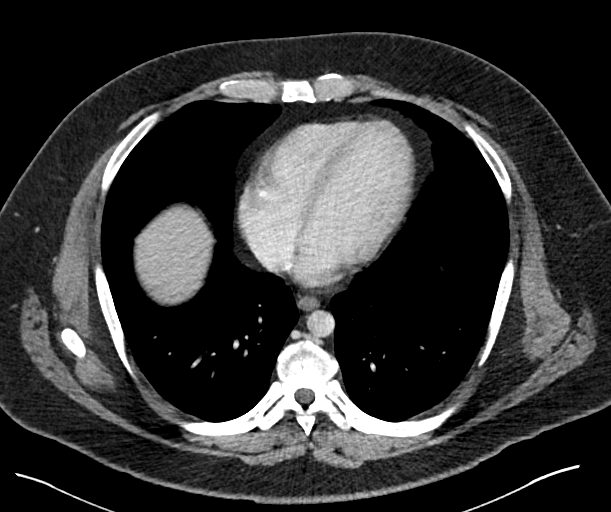

[Series 4: abdomen_pelvis cor 3.00 br40 s3 · coronal · 0.83mm/px · 3 of 116 slices shown]
[im 39/116  soft-tissue]
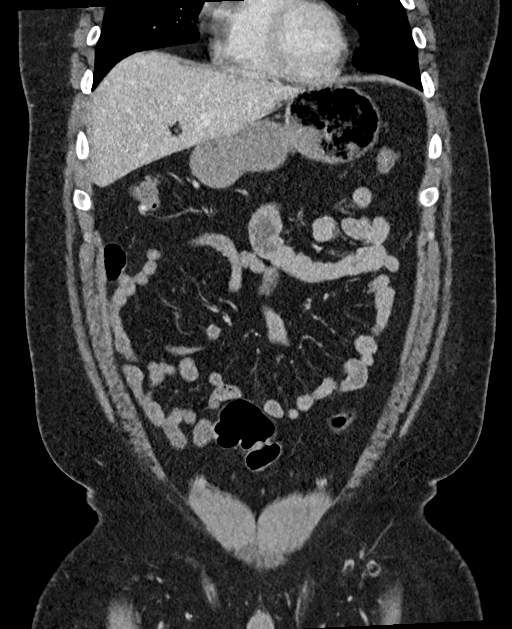
[im 52/116  soft-tissue]
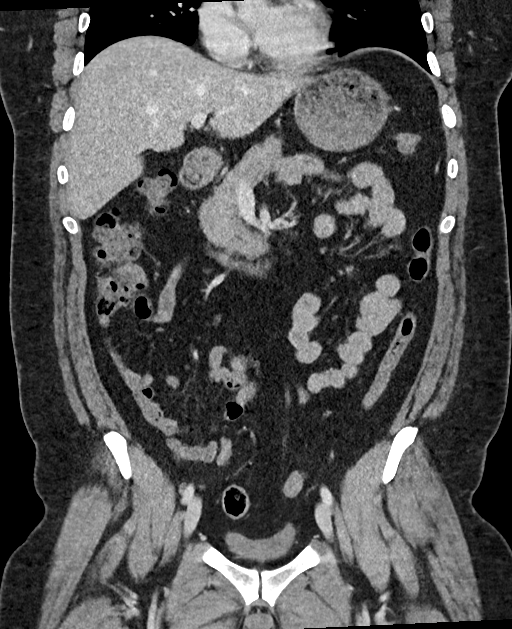
[im 64/116  soft-tissue]
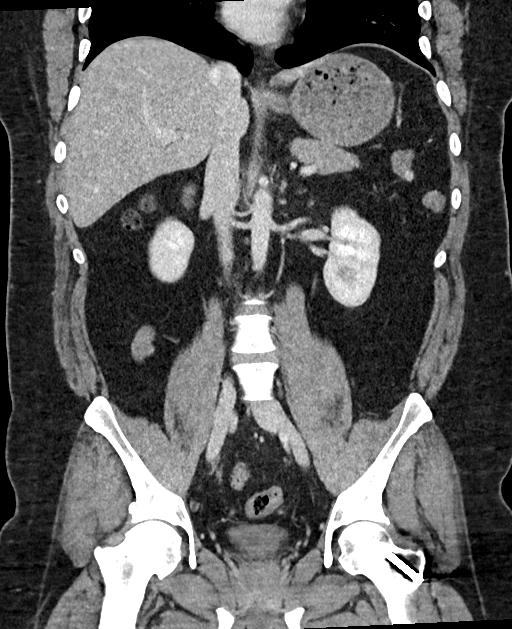

[11 of 46 positions shown; findings below may reference images not displayed]

All CT scans at this facility use dose modulation, interval reconstruction, and/or weight-based
dosing when appropriate to reduce radiation dose to as low as reasonably achievable.

Number of previous computed tomography exams in the last 12 months is 2 .

Number of previous nuclear medicine myocardial perfusion studies in the last 12 months is 0 .

CONTRAST:  100 mL of low osmolar Omnipaque 300 iodinated contrast was intravenously administered.
FINDINGS: LUNG BASES:  NO consolidations or pleural effusions. Minimal dependent changes/atelectasis.

HEART:  The heart is normal in size and position.

LIVER:  Liver demonstrates NO mass or abnormal enlargement.

Minimal changes in the hepatic parenchyma consistent with fatty infiltration.

GALLBLADDER AND BILE DUCTS:  Postsurgical changes from prior cholecystectomy.

No ductal dilation.

PANCREAS:  The pancreas demonstrates NO definite mass. NO definite evidence of edema and/or
inflammatory changes as visualized.

No ductal dilation.

SPLEEN:  The spleen demonstrates NO mass or abnormal enlargement.

ADRENALS:  The adrenal glands demonstrate no suspicious masses or abnormal enlargement.

KIDNEYS AND URETERS:  RIGHT KIDNEY: The RIGHT kidney demonstrates NO hydronephrosis, renal stones
or discrete masses.

LEFT KIDNEY: The LEFT kidney demonstrates NO hydronephrosis, renal stones or discrete masses.

URETERS: NO definite ureteral stones.

STOMACH AND BOWEL:  STOMACH: Minimal distention with food debris and a to a lesser extent fluid. NO
wall thickening.

SMALL BOWEL: The small bowel demonstrates no evidence of abnormal thickening or distention as
visualized.

COLON: Areas of incomplete distention, spasm and/or contraction. Minimal diverticulosis. NO
evidence of diverticulitis.

The colon is partially fecal filled. No distention. This is most prominent on the RIGHT.

APPENDIX:  The appendix is surgically absent by history.

BLADDER:  The bladder is nondistended, thickened appearance likely representing pseudothickening.

PELVIS: NO definite evidence of pelvic masses.

REPRODUCTIVE:  NO evidence of pelvic masses or significant adenopathy.

INTRAPERITONEAL SPACE:  No definite acute findings.  No free air.  No significant fluid collection.

BONES/JOINTS:  NO definite acute compression or posterior element fractures.

NO evidence of acute fractures in the nonspinal bony structures. Postsurgical changes in the
proximal femurs and femoral necks bilaterally. Hardware on the LEFT remains in position. NO gross
abnormalities.

No dislocation.

SOFT TISSUES:  Soft tissues demonstrate NO definite acute changes or radiopaque foreign body.

VASCULATURE:  AORTA: The aorta demonstrates no evidence of aneurysm or rupture as visualized.

LYMPH NODES:  NO definite, significant pelvic adenopathy.
IMPRESSION: - NO bowel or renal obstruction or diverticulitis.

- Minimal changes in the hepatic parenchyma consistent with fatty infiltration.

- There are chronic/incidental findings as described.

Tech Notes:

Pt c/o abdomen pain x 4 days. H/o chole and appy. WTRE4FC 7MON9 used. CT/NM 2/0. CF

------------- REPORT [id number] -------------
DIAGNOSTIC STUDIES

EXAM:  CT ABDOMEN AND PELVIS WITH INTRAVENOUS CONTRAST  ([id number])
All CT scans at this facility use dose modulation, interval reconstruction, and/or weight-based
dosing when appropriate to reduce radiation dose to as low as reasonably achievable.

Number of previous computed tomography exams in the last 12 months is 2 .

Number of previous nuclear medicine myocardial perfusion studies in the last 12 months is 0 .

CONTRAST:  100 mL of low osmolar Omnipaque 300 iodinated contrast was intravenously administered.
FINDINGS: LUNG BASES:  NO consolidations or pleural effusions. Minimal dependent changes/atelectasis.

HEART:  The heart is normal in size and position.

LIVER:  Liver demonstrates NO mass or abnormal enlargement.

Minimal changes in the hepatic parenchyma consistent with fatty infiltration.

GALLBLADDER AND BILE DUCTS:  Postsurgical changes from prior cholecystectomy.

No ductal dilation.

PANCREAS:  The pancreas demonstrates NO definite mass. NO definite evidence of edema and/or
inflammatory changes as visualized.

No ductal dilation.

SPLEEN:  The spleen demonstrates NO mass or abnormal enlargement.

ADRENALS:  The adrenal glands demonstrate no suspicious masses or abnormal enlargement.

KIDNEYS AND URETERS:  RIGHT KIDNEY: The RIGHT kidney demonstrates NO hydronephrosis, renal stones
or discrete masses.

LEFT KIDNEY: The LEFT kidney demonstrates NO hydronephrosis, renal stones or discrete masses.

URETERS: NO definite ureteral stones.

STOMACH AND BOWEL:  STOMACH: Minimal distention with food debris and a to a lesser extent fluid. NO
wall thickening.

SMALL BOWEL: The small bowel demonstrates no evidence of abnormal thickening or distention as
visualized.

COLON: Areas of incomplete distention, spasm and/or contraction. Minimal diverticulosis. NO
evidence of diverticulitis.

The colon is partially fecal filled. No distention. This is most prominent on the RIGHT.

APPENDIX:  The appendix is surgically absent by history.

BLADDER:  The bladder is nondistended, thickened appearance likely representing pseudothickening.

PELVIS: NO definite evidence of pelvic masses.

REPRODUCTIVE:  NO evidence of pelvic masses or significant adenopathy.

INTRAPERITONEAL SPACE:  No definite acute findings.  No free air.  No significant fluid collection.

BONES/JOINTS:  NO definite acute compression or posterior element fractures.

NO evidence of acute fractures in the nonspinal bony structures. Postsurgical changes in the
proximal femurs and femoral necks bilaterally. Hardware on the LEFT remains in position. NO gross
abnormalities.

No dislocation.

SOFT TISSUES:  Soft tissues demonstrate NO definite acute changes or radiopaque foreign body.

VASCULATURE:  AORTA: The aorta demonstrates no evidence of aneurysm or rupture as visualized.

LYMPH NODES:  NO definite, significant pelvic adenopathy.
IMPRESSION: - NO bowel or renal obstruction or diverticulitis.

- Minimal changes in the hepatic parenchyma consistent with fatty infiltration.

- There are chronic/incidental findings as described.

Tech Notes:

Pt c/o abdomen pain x 4 days. H/o chole and appy. WTRE4FC 7MON9 used. CT/NM 2/0. CF

## 2020-03-10 IMAGING — CT ABDOMEN_PELVIS W(Adult)
2 of 3 series · 12 of 46 positions shown, 14 images · IV contrast (agent unspecified)
Comparison: none

------------- REPORT [id number] -------------
PROCEDURE: ABDOMEN_PELVIS W(Adult)
HISTORY: FOR PAST 8 DAYS, PT STATES HAS HAD PAIN THROUGHOUT HIS ABDOMEN, TONIGHT
STATES WORSE ON LEFT SIDE, WITH DIARRHEA. HX OF CHOLE, APPY, AND IBS.
CREAT 1.02 GFR 136 OMNI 300/71E5P CTDI
PRIORS 3/0
TR
TECHNIQUE: Axial CT imaging of the abdomen and pelvis was performed with IV contrast.
This exam was performed using one or more the following dose reduction techniques:
Automated exposure control, adjustment of the mA and/or KV according to the patient's
size or use of iterative reconstruction technique. Total DLP dose measures 856 mGy with a
total CTDI dose measuring 15 mGy.

[Series 2: abdomen_pelvis ax 3.00 br40 s3 · axial · 0.60mm/px · z∈[+1279,+1735]mm · 9 of 174 slices shown, 11 images]
[im 12/174  soft-tissue]
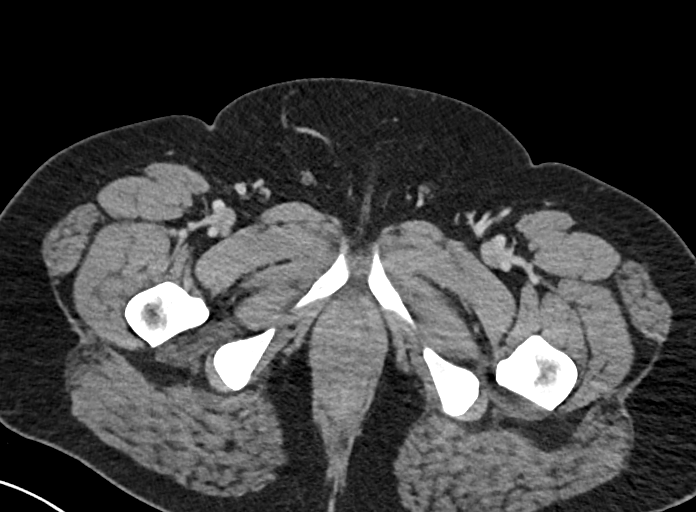
[im 12/174  bone]
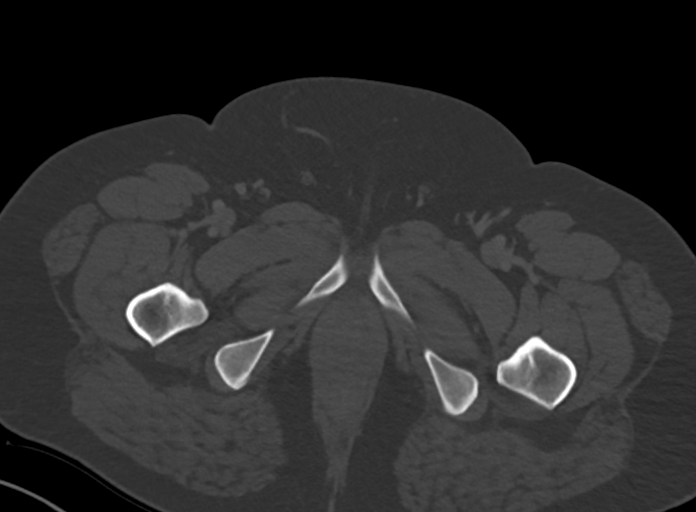
[im 34/174  soft-tissue]
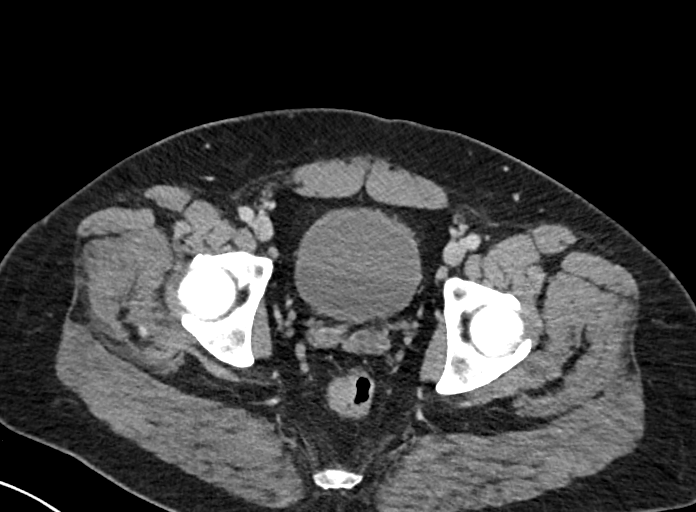
[im 51/174  soft-tissue]
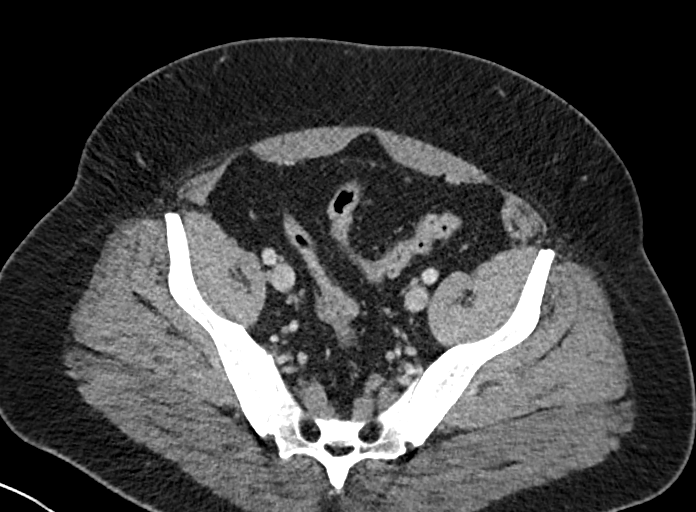
[im 67/174  soft-tissue]
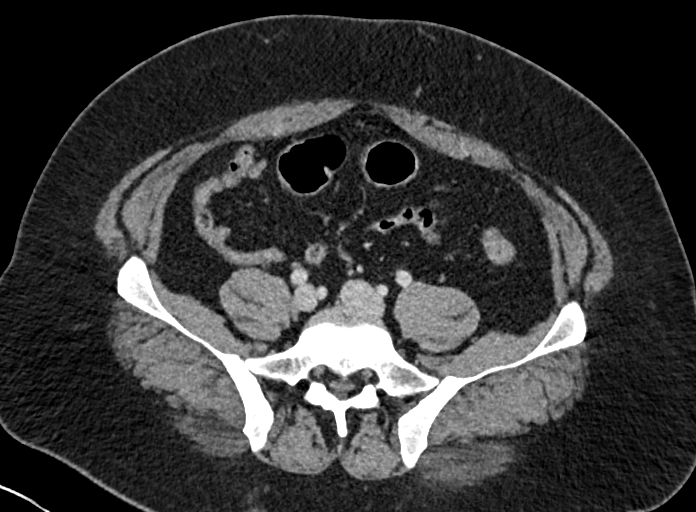
[im 90/174  soft-tissue]
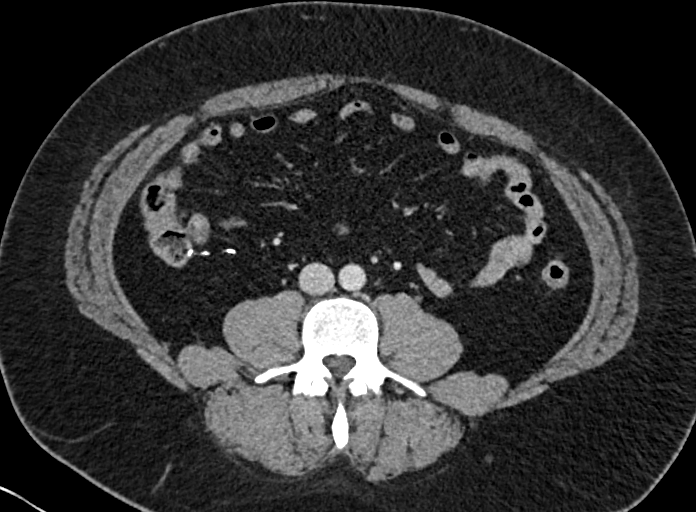
[im 107/174  soft-tissue]
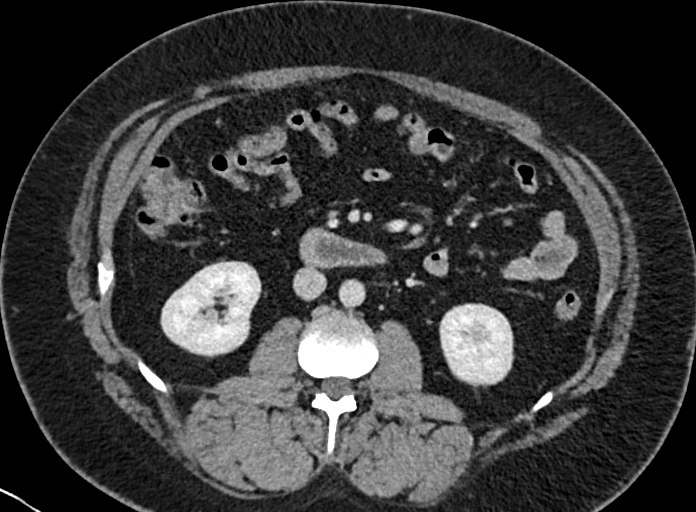
[im 123/174  soft-tissue]
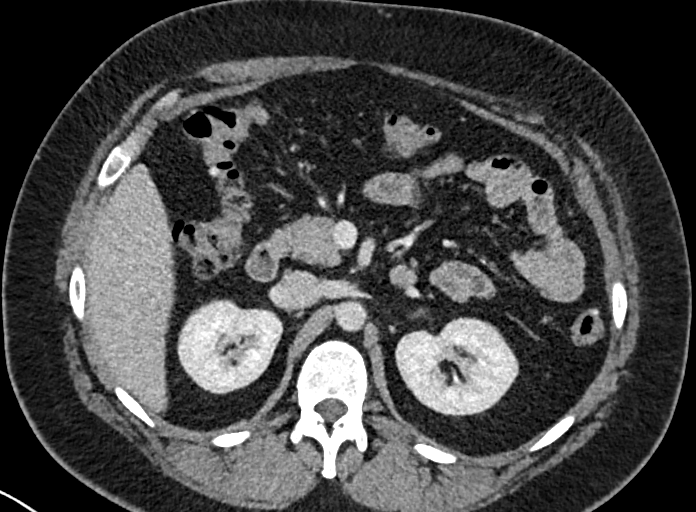
[im 146/174  soft-tissue]
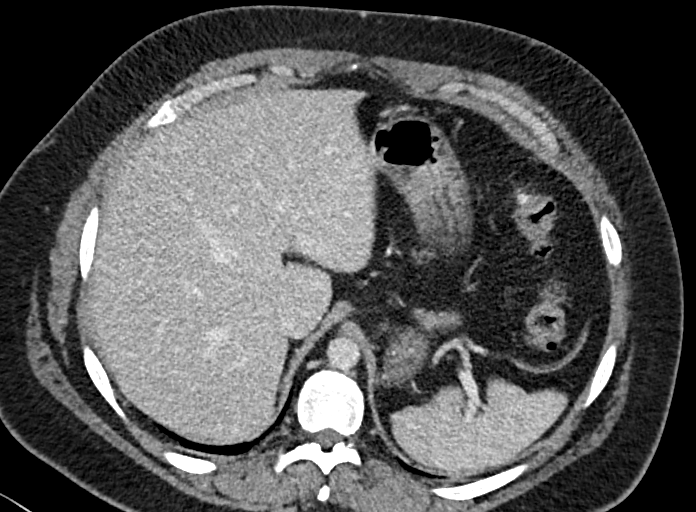
[im 162/174  soft-tissue]
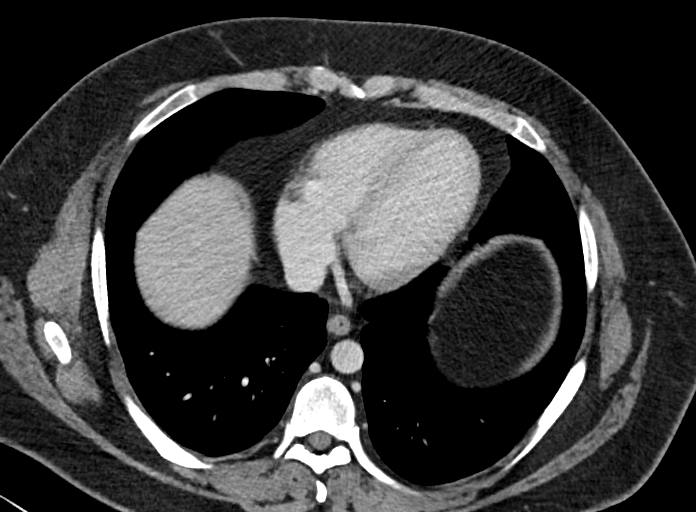
[im 162/174  bone]
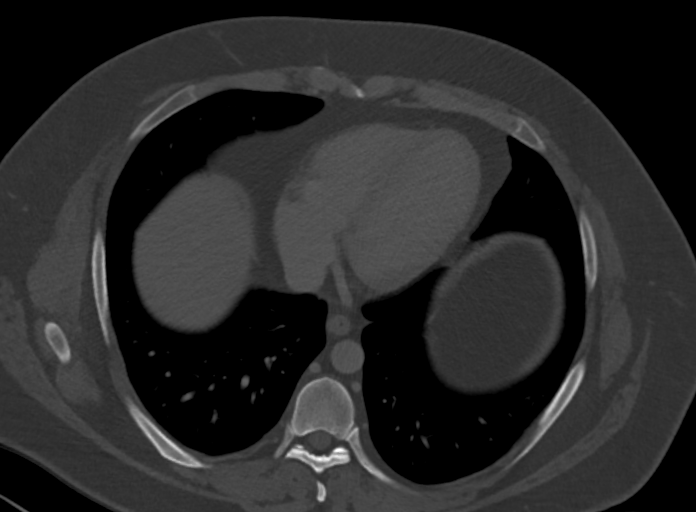

[Series 4: abdomen_pelvis cor 3.00 br40 s3 · coronal · 0.82mm/px · 3 of 100 slices shown]
[im 34/100  soft-tissue]
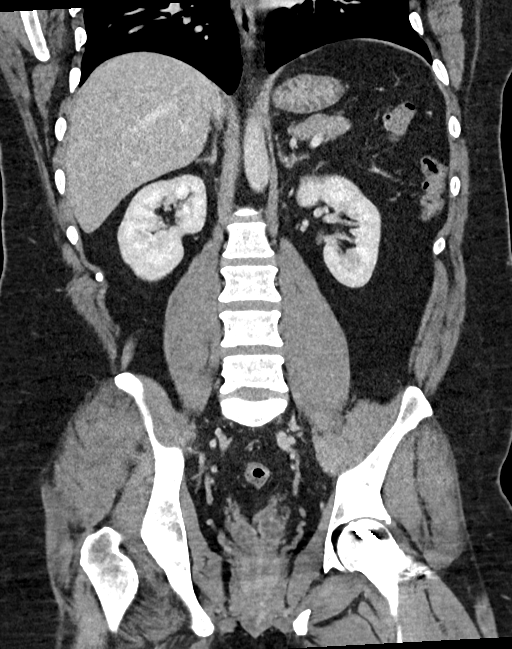
[im 45/100  soft-tissue]
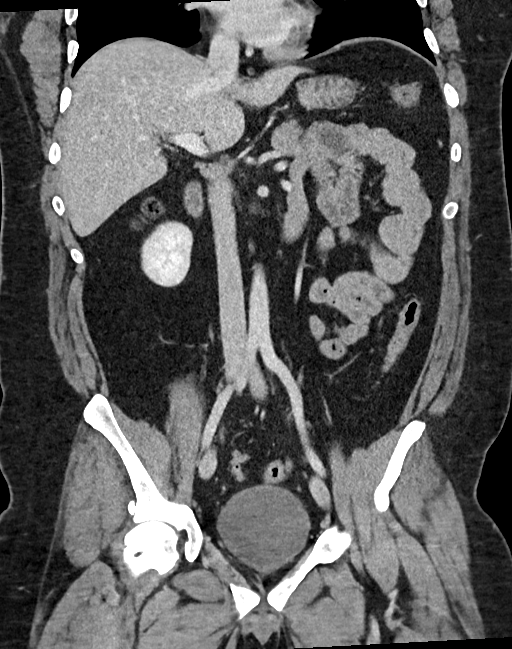
[im 56/100  soft-tissue]
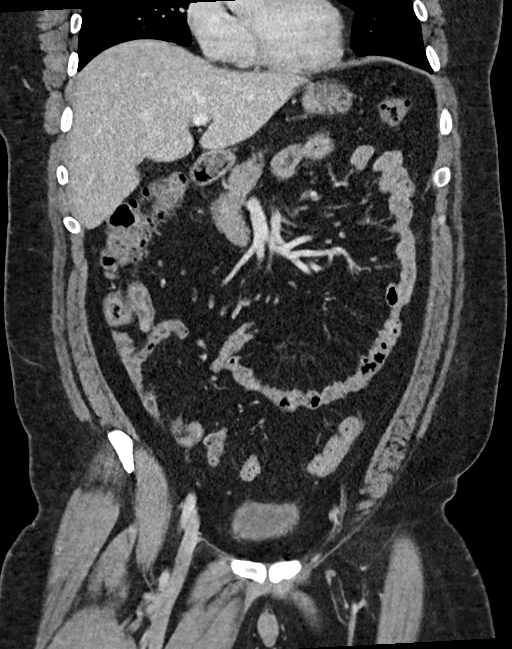

[12 of 46 positions shown; findings below may reference images not displayed]

FINDINGS: The lung bases are clear. Surgical changes are seen reflecting cholecystectomy. The
liver, spleen, pancreas, and adrenals are within normal limits. There is no intrahepatic
ductal dilatation seen. No mesenteric or retroperitoneal adenopathy is seen. The right
kidney shows no hydronephrosis or perinephric fat stranding. The left kidney shows no
hydronephrosis or perinephric fat stranding. The aorta is normal in course and caliber. No
pathologically dilated loops of large or small bowel are seen. Surgical changes are seen
reflecting appendectomy. There is no free air or free fluid seen in the abdomen. The
contrasted urinary bladder is unremarkable.
IMPRESSION: 1. There is no hydronephrosis or perinephric fat stranding seen.
2. Nonobstructive bowel gas pattern without free air. Normal appearing appendix is seen in
the right lower quadrant.

Tech Notes:

FOR PAST 8 DAYS, PT STATES HAS HAD PAIN THROUGHOUT HIS ABDOMEN, TONIGHT STATES WORSE ON LEFT SIDE,
WITH DIARRHEA.  HX OF CHOLE, APPY, AND IBS.
CREAT 1.02 GFR 136 OMNI 300/71E5P CTDI
PRIORS 3/0
TR

------------- REPORT [id number] -------------
PROCEDURE: ABDOMEN_PELVIS W(Adult)
FINDINGS: The lung bases are clear. Surgical changes are seen reflecting cholecystectomy. The
liver, spleen, pancreas, and adrenals are within normal limits. There is no intrahepatic
ductal dilatation seen. No mesenteric or retroperitoneal adenopathy is seen. The right
kidney shows no hydronephrosis or perinephric fat stranding. The left kidney shows no
hydronephrosis or perinephric fat stranding. The aorta is normal in course and caliber. No
pathologically dilated loops of large or small bowel are seen. Surgical changes are seen
reflecting appendectomy. There is no free air or free fluid seen in the abdomen. The
contrasted urinary bladder is unremarkable.
IMPRESSION: 1. There is no hydronephrosis or perinephric fat stranding seen.
2. Nonobstructive bowel gas pattern without free air. Normal appearing appendix is seen in
the right lower quadrant.

Tech Notes:

FOR PAST 8 DAYS, PT STATES HAS HAD PAIN THROUGHOUT HIS ABDOMEN, TONIGHT STATES WORSE ON LEFT SIDE,
WITH DIARRHEA.  HX OF CHOLE, APPY, AND IBS.
CREAT 1.02 GFR 136 OMNI 300/71E5P CTDI
PRIORS 3/0
TR

## 2020-07-08 IMAGING — CT STONE PROTOCOL(Adult)
2 of 3 series · 9 of 46 positions shown, 10 images · non-contrast
Comparison: December 17, 2021.
COMPARISON: December 17, 2021.
COMPARISON: December 17, 2021.
COMPARISON: December 17, 2021.

------------- REPORT [id number] -------------
EXAM:  CT ABDOMEN AND PELVIS WITHOUT INTRAVENOUS CONTRAST  ([id number])
INDICATION: Flank pain, hematuria Patient states that he has had right flank pain for 2 days and
hematuria for 1.5 days. Patient states that he has a history of kidney stones. History of
appendectomy, cholecystectomy, and hernia repair. Previous CT
TECHNIQUE: Axial computed tomography images of the abdomen and pelvis without intravenous
contrast.  Sagittal and coronal reformatted images were created and reviewed.

[Series 2: abdomen ax 2.00 br40 s3 · axial · 0.69mm/px · z∈[+1130,+1551]mm · 6 of 263 slices shown, 7 images]
[im 26/263  soft-tissue]
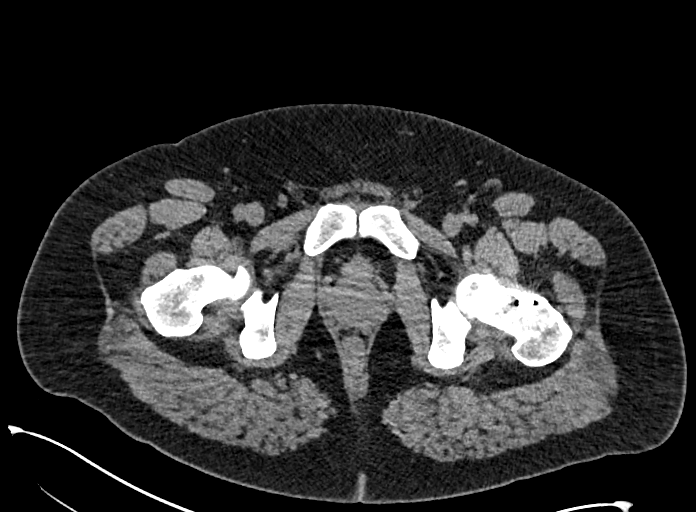
[im 26/263  bone]
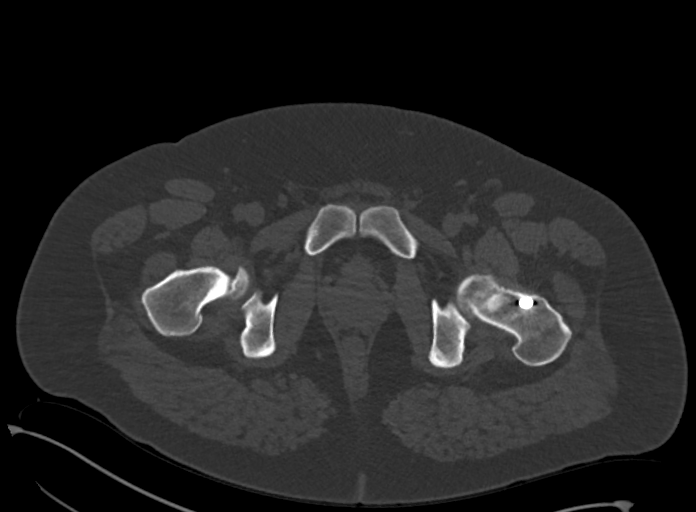
[im 68/263  soft-tissue]
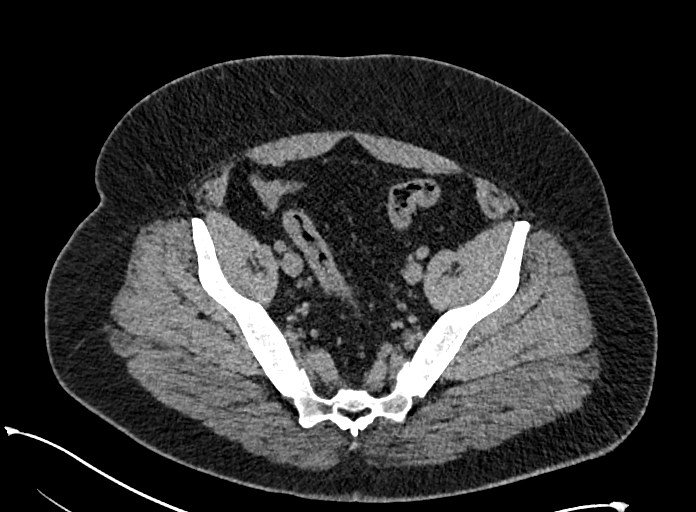
[im 110/263  soft-tissue]
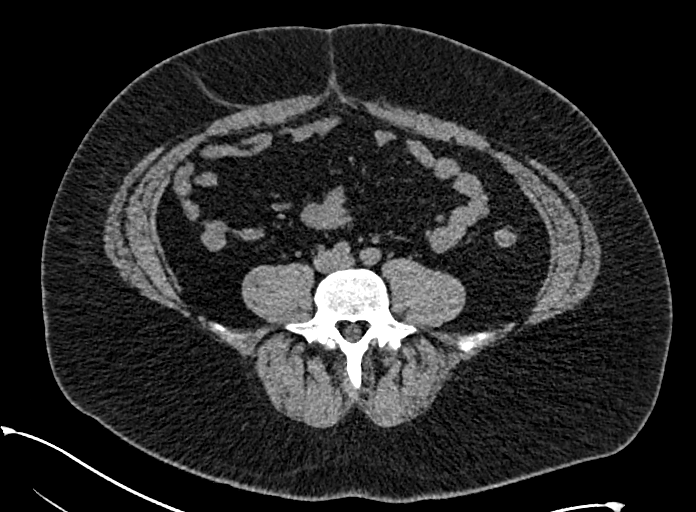
[im 153/263  soft-tissue]
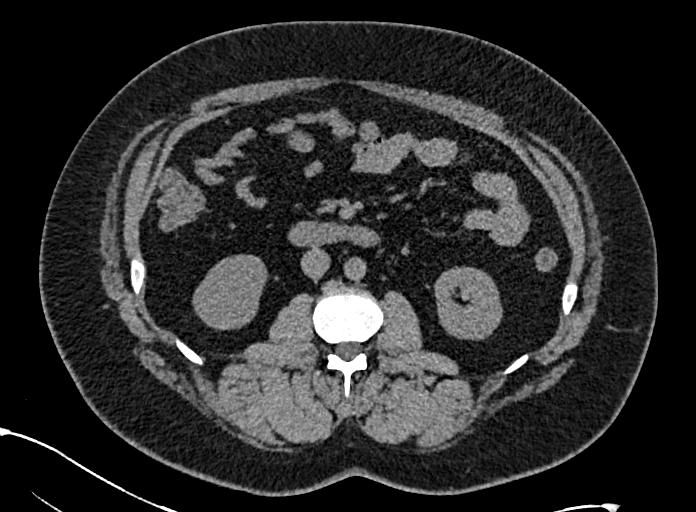
[im 195/263  soft-tissue]
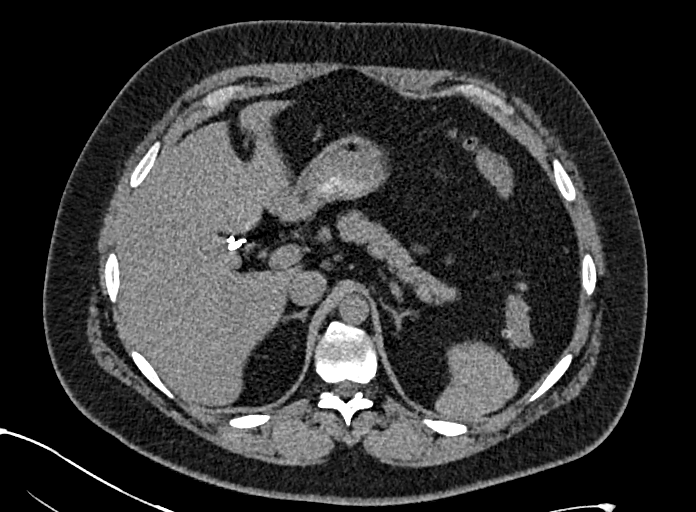
[im 237/263  soft-tissue]
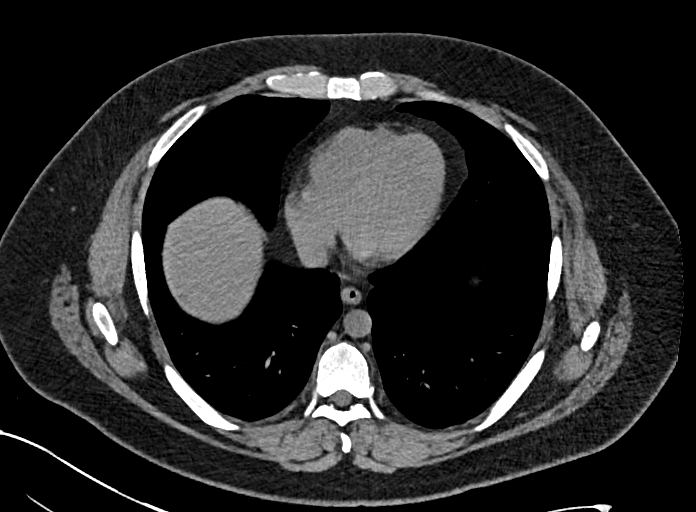

[Series 4: abdomen cor 2.00 br40 s3 · coronal · 0.94mm/px · 3 of 177 slices shown]
[im 59/177  soft-tissue]
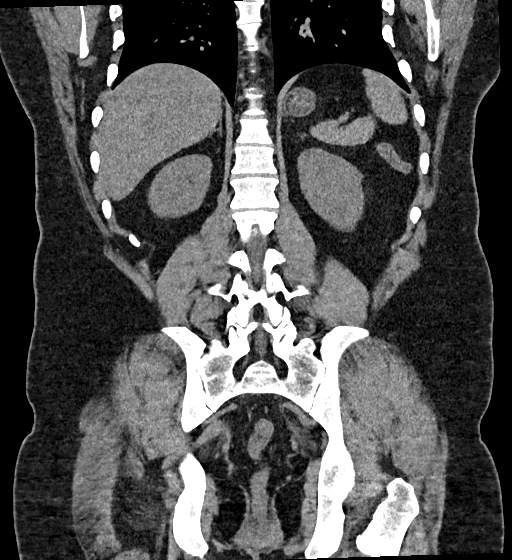
[im 79/177  soft-tissue]
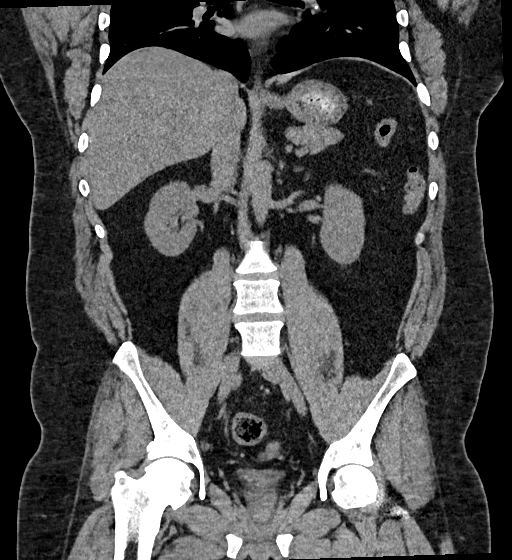
[im 98/177  soft-tissue]
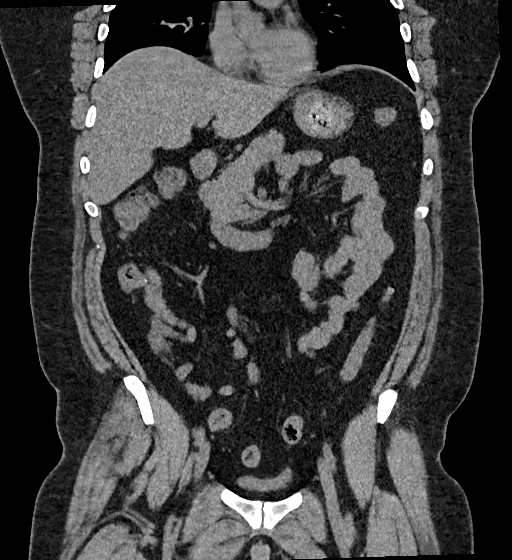

[9 of 46 positions shown; findings below may reference images not displayed]

FINDINGS: LUNG BASES:  No acute findings. No pulmonary consolidation or mass.

LIVER:  Hepatic steatosis.

GALLBLADDER AND BILE DUCTS:  Cholecystectomy.  No complicating features.

PANCREAS:  No pancreas edema or peripancreatic fluid.

SPLEEN:  No splenomegaly.

ADRENALS:  No discrete lesion.

KIDNEYS AND URETERS:  No obstructive renal calculus or hydronephrosis.

STOMACH AND BOWEL: Mild thickening of the sigmoid colon wall in part related to nondistention.

Early sigmoid colon/mesocolon inflammation noted better seen on image 90/4 and 215/2, with
associated diverticula.   Early sigmoid diverticulitis suggested. No loculated fluid collection,
abscess, free air, or mechanical bowel obstruction.

APPENDIX:  Appendix is surgically absent.

BLADDER:  Bladder decompressed limiting assessment of the wall.

REPRODUCTIVE:  No acute findings. NO discrete mass.

INTRAPERITONEAL SPACE:  See above.

BONES/JOINTS:  Osseous degenerative and spondylotic changes.

Minimal degenerative changes.  Cancellous screw through the left-sided proximal femur.

VASCULATURE:  No abdominal aorta aneurysm.

LYMPH NODES:  No pathologic by size criteria lymph nodes.
IMPRESSION: - Early sigmoid diverticulitis suggested. No loculated fluid collection, abscess, free air, or
mechanical bowel obstruction.

- No obstructive renal calculus or hydronephrosis.

- There are other chronic/incidental findings as described.

RECOMMENDATIONS:

- Follow up recommended after treatment to reassess and exclude early mucosal neoplastic lesion.

Tech Notes:

Patient states that he has had right flank pain for 2 days and hematuria for 1.5 days. Patient
states that he has a history of kidney stones. History of appendectomy, cholecystectomy, and hernia
repair. Previous CT 12/21/21. TB

------------- REPORT [id number] -------------
EXAM:  CT ABDOMEN AND PELVIS WITHOUT INTRAVENOUS CONTRAST  ([id number])
FINDINGS: LUNG BASES:  No acute findings. No pulmonary consolidation or mass.

LIVER:  Hepatic steatosis.

GALLBLADDER AND BILE DUCTS:  Cholecystectomy.  No complicating features.

PANCREAS:  No pancreas edema or peripancreatic fluid.

SPLEEN:  No splenomegaly.

ADRENALS:  No discrete lesion.

KIDNEYS AND URETERS:  No obstructive renal calculus or hydronephrosis.

STOMACH AND BOWEL: Mild thickening of the sigmoid colon wall in part related to nondistention.

Early sigmoid colon/mesocolon inflammation noted better seen on image 90/4 and 215/2, with
associated diverticula.   Early sigmoid diverticulitis suggested. No loculated fluid collection,
abscess, free air, or mechanical bowel obstruction.

APPENDIX:  Appendix is surgically absent.

BLADDER:  Bladder decompressed limiting assessment of the wall.

REPRODUCTIVE:  No acute findings. NO discrete mass.

INTRAPERITONEAL SPACE:  See above.

BONES/JOINTS:  Osseous degenerative and spondylotic changes.

Minimal degenerative changes.  Cancellous screw through the left-sided proximal femur.

VASCULATURE:  No abdominal aorta aneurysm.

LYMPH NODES:  No pathologic by size criteria lymph nodes.
IMPRESSION: - Early sigmoid diverticulitis suggested. No loculated fluid collection, abscess, free air, or
mechanical bowel obstruction.

- No obstructive renal calculus or hydronephrosis.

- There are other chronic/incidental findings as described.

RECOMMENDATIONS:

- Follow up recommended after treatment to reassess and exclude early mucosal neoplastic lesion.

Tech Notes:

Patient states that he has had right flank pain for 2 days and hematuria for 1.5 days. Patient
states that he has a history of kidney stones. History of appendectomy, cholecystectomy, and hernia
repair. Previous CT 12/21/21. CT/NM 2/0. TB

------------- REPORT [id number] -------------
**ADDENDUM**
ADDENDUM

All CT scans at this facility use dose modulation, iterative reconstruction, and/or weight based
dosing when appropriate to reduce radiation dose to as low as reasonably achievable.

Number of previous computed tomography exams in the last 12 months is 5.

Number of previous nuclear medicine myocardial perfusion studies in the last 12 months is 0.

TD/TT: /

EXAM:  CT ABDOMEN AND PELVIS WITHOUT INTRAVENOUS CONTRAST  ([id number])
FINDINGS: LUNG BASES:  No acute findings. No pulmonary consolidation or mass.

LIVER:  Hepatic steatosis.

GALLBLADDER AND BILE DUCTS:  Cholecystectomy.  No complicating features.

PANCREAS:  No pancreas edema or peripancreatic fluid.

SPLEEN:  No splenomegaly.

ADRENALS:  No discrete lesion.

KIDNEYS AND URETERS:  No obstructive renal calculus or hydronephrosis.

STOMACH AND BOWEL: Mild thickening of the sigmoid colon wall in part related to nondistention.

Early sigmoid colon/mesocolon inflammation noted better seen on image 90/4 and 215/2, with
associated diverticula.   Early sigmoid diverticulitis suggested. No loculated fluid collection,
abscess, free air, or mechanical bowel obstruction.

APPENDIX:  Appendix is surgically absent.

BLADDER:  Bladder decompressed limiting assessment of the wall.

REPRODUCTIVE:  No acute findings. NO discrete mass.

INTRAPERITONEAL SPACE:  See above.

BONES/JOINTS:  Osseous degenerative and spondylotic changes.

Minimal degenerative changes.  Cancellous screw through the left-sided proximal femur.

VASCULATURE:  No abdominal aorta aneurysm.

LYMPH NODES:  No pathologic by size criteria lymph nodes.
IMPRESSION: - Early sigmoid diverticulitis suggested. No loculated fluid collection, abscess, free air, or
mechanical bowel obstruction.

- No obstructive renal calculus or hydronephrosis.

- There are other chronic/incidental findings as described.

RECOMMENDATIONS:

- Follow up recommended after treatment to reassess and exclude early mucosal neoplastic lesion.

Tech Notes:

Patient states that he has had right flank pain for 2 days and hematuria for 1.5 days. Patient
states that he has a history of kidney stones. History of appendectomy, cholecystectomy, and hernia
repair. Previous CT 12/21/21. CT/NM 2/0. TB

------------- REPORT [id number] -------------
**ADDENDUM**
ADDENDUM

All CT scans at this facility use dose modulation, iterative reconstruction, and/or weight based
dosing when appropriate to reduce radiation dose to as low as reasonably achievable.

Number of previous computed tomography exams in the last 12 months is 5.

Number of previous nuclear medicine myocardial perfusion studies in the last 12 months is 0.

TD/TT: /

EXAM:  CT ABDOMEN AND PELVIS WITHOUT INTRAVENOUS CONTRAST  ([id number])
FINDINGS: LUNG BASES:  No acute findings. No pulmonary consolidation or mass.

LIVER:  Hepatic steatosis.

GALLBLADDER AND BILE DUCTS:  Cholecystectomy.  No complicating features.

PANCREAS:  No pancreas edema or peripancreatic fluid.

SPLEEN:  No splenomegaly.

ADRENALS:  No discrete lesion.

KIDNEYS AND URETERS:  No obstructive renal calculus or hydronephrosis.

STOMACH AND BOWEL: Mild thickening of the sigmoid colon wall in part related to nondistention.

Early sigmoid colon/mesocolon inflammation noted better seen on image 90/4 and 215/2, with
associated diverticula.   Early sigmoid diverticulitis suggested. No loculated fluid collection,
abscess, free air, or mechanical bowel obstruction.

APPENDIX:  Appendix is surgically absent.

BLADDER:  Bladder decompressed limiting assessment of the wall.

REPRODUCTIVE:  No acute findings. NO discrete mass.

INTRAPERITONEAL SPACE:  See above.

BONES/JOINTS:  Osseous degenerative and spondylotic changes.

Minimal degenerative changes.  Cancellous screw through the left-sided proximal femur.

VASCULATURE:  No abdominal aorta aneurysm.

LYMPH NODES:  No pathologic by size criteria lymph nodes.
IMPRESSION: - Early sigmoid diverticulitis suggested. No loculated fluid collection, abscess, free air, or
mechanical bowel obstruction.

- No obstructive renal calculus or hydronephrosis.

- There are other chronic/incidental findings as described.

RECOMMENDATIONS:

- Follow up recommended after treatment to reassess and exclude early mucosal neoplastic lesion.

Tech Notes:

Patient states that he has had right flank pain for 2 days and hematuria for 1.5 days. Patient
states that he has a history of kidney stones. History of appendectomy, cholecystectomy, and hernia
repair. Previous CT 12/21/21. CT/NM 2/0. TB

## 2021-04-10 IMAGING — MR Knee^Routine
3 series · 13 of 13 positions shown · non-contrast
Comparison: none

[Series 3: T2 fat-sat · axial · 4.0mm · 0.50mm/px · z∈[-69,-4]mm · 7 of 7 slices shown (1 of 2)]
[im 1/7]
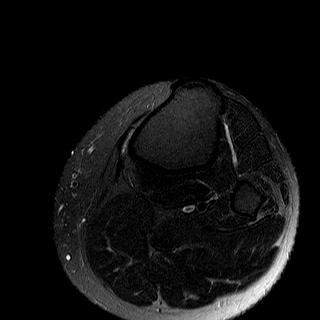
[im 2/7]
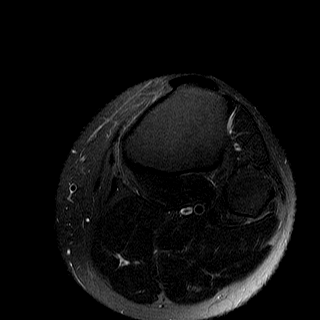
[im 3/7]
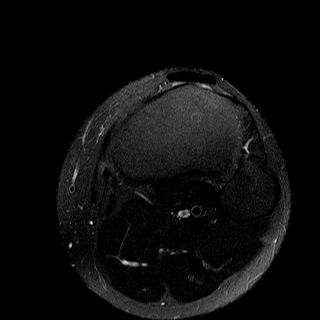
[im 4/7]
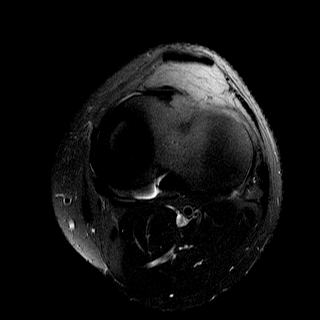
[im 5/7]
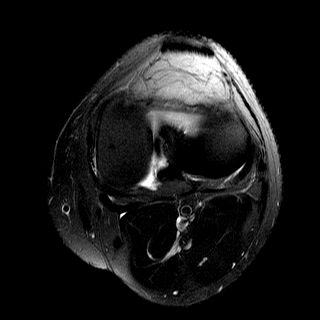
[im 6/7]
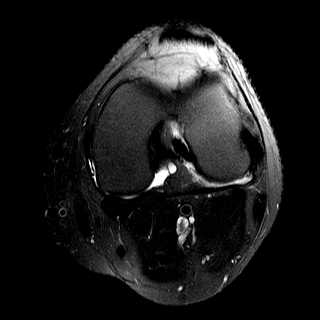
[im 7/7]
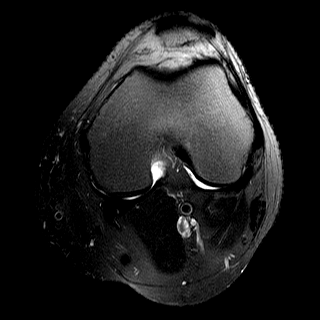

[Series 4: T1 · axial · 4.0mm · 0.62mm/px · z∈[+10,+14]mm · 2 of 2 slices shown]
[im 1/2]
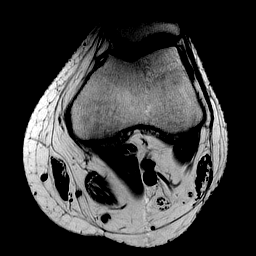
[im 2/2]
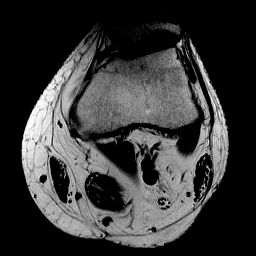

[Series 5: T2 fat-sat · coronal · 3.5mm · 0.50mm/px · 4 of 4 slices shown (2 of 2)]
[im 1/4]
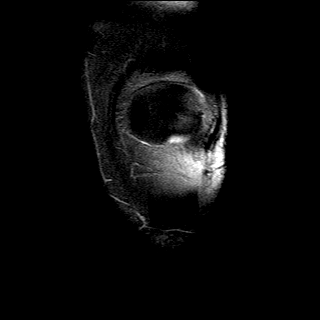
[im 2/4]
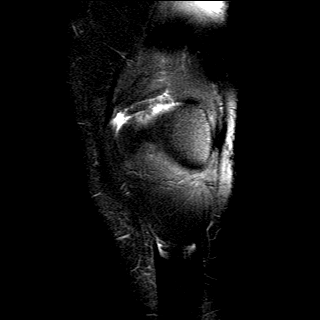
[im 3/4]
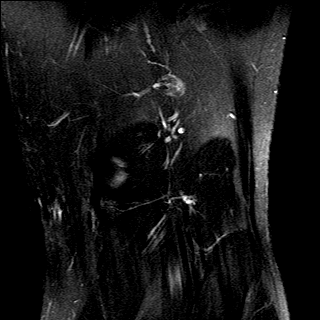
[im 4/4]
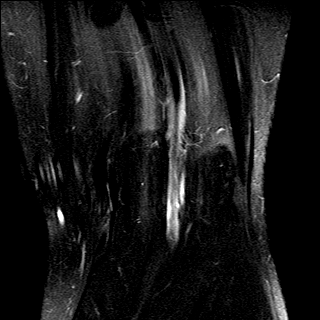

[13 of 13 positions shown; findings below may reference images not displayed]

------------- REPORT [id number] -------------
DIAGNOSTIC STUDIES

EXAM

Left knee MRI.

INDICATION

left knee pain
LEFT LATERAL KNEE PAIN, WORSE WITH WALKING STAIRS.  RG

TECHNIQUE

Noncontrast left knee MRI protocol.

COMPARISONS

A March 27, 2023 knee radiographs.

FINDINGS

Menisci: The medial and lateral menisci are intact.

Ligaments and tendons: The anterior cruciate ligament, posterior cruciate ligament, medial
collateral ligament, and lateral collateral ligament complex are intact. The popliteus tendon is
unremarkable. The patellar and quadriceps tendons are intact.

Marrow/joint space: There is a small amount of joint fluid. The medial and lateral compartment
articular cartilage is intact. There is reactive marrow signal at the central femoral trochlea
without definite overlying cartilage deficit, though there is suspicion for mild chondrosis of the
anterior compartment. No other bone marrow edema or fracture. No aggressive osseous lesion.

IMPRESSION

Subchondral reactive marrow edema of the femoral trochlea without compelling evidence of internal
derangement.

Tech Notes:

LEFT LATERAL KNEE PAIN, WORSE WITH WALKING STAIRS.  RG

------------- REPORT [id number] -------------
DIAGNOSTIC STUDIES

EXAM

Left knee MRI.

INDICATION

left knee pain
LEFT LATERAL KNEE PAIN, WORSE WITH WALKING STAIRS.  RG

TECHNIQUE

Noncontrast left knee MRI protocol.

COMPARISONS

A March 27, 2023 knee radiographs.

FINDINGS

Menisci: The medial and lateral menisci are intact.

Ligaments and tendons: The anterior cruciate ligament, posterior cruciate ligament, medial
collateral ligament, and lateral collateral ligament complex are intact. The popliteus tendon is
unremarkable. The patellar and quadriceps tendons are intact.

Marrow/joint space: There is a small amount of joint fluid. The medial and lateral compartment
articular cartilage is intact. There is reactive marrow signal at the central femoral trochlea
without definite overlying cartilage deficit, though there is suspicion for mild chondrosis of the
anterior compartment. No other bone marrow edema or fracture. No aggressive osseous lesion.

IMPRESSION

Subchondral reactive marrow edema of the femoral trochlea without compelling evidence of internal
derangement.

Tech Notes:

LEFT LATERAL KNEE PAIN, WORSE WITH WALKING STAIRS.  RG

## 2021-11-17 ENCOUNTER — Encounter: Admit: 2021-11-17 | Discharge: 2021-11-17

## 2021-12-01 ENCOUNTER — Encounter: Admit: 2021-12-01 | Discharge: 2021-12-01 | Payer: MEDICAID

## 2021-12-01 DIAGNOSIS — M25562 Pain in left knee: Secondary | ICD-10-CM

## 2021-12-02 ENCOUNTER — Encounter: Admit: 2021-12-02 | Discharge: 2021-12-02 | Payer: MEDICAID

## 2021-12-02 DIAGNOSIS — R69 Illness, unspecified: Secondary | ICD-10-CM

## 2021-12-24 ENCOUNTER — Encounter: Admit: 2021-12-24 | Discharge: 2021-12-24 | Payer: MEDICAID

## 2022-01-22 ENCOUNTER — Encounter: Admit: 2022-01-22 | Discharge: 2022-01-22 | Payer: MEDICAID

## 2022-01-25 ENCOUNTER — Encounter: Admit: 2022-01-25 | Discharge: 2022-01-25 | Payer: MEDICAID

## 2022-01-25 ENCOUNTER — Ambulatory Visit: Admit: 2022-01-25 | Discharge: 2022-01-25 | Payer: MEDICAID

## 2022-01-25 DIAGNOSIS — E079 Disorder of thyroid, unspecified: Secondary | ICD-10-CM

## 2022-01-25 DIAGNOSIS — S83282A Other tear of lateral meniscus, current injury, left knee, initial encounter: Secondary | ICD-10-CM

## 2022-01-25 DIAGNOSIS — K929 Disease of digestive system, unspecified: Secondary | ICD-10-CM

## 2022-01-25 DIAGNOSIS — M25562 Pain in left knee: Secondary | ICD-10-CM

## 2022-01-25 NOTE — Progress Notes
SUBJECTIVE:    Mitchell Petersen is a 36 y.o. male who presents today with a chief complaint of left knee pain.  Patient indicates that this knee has been bothering him for over a year.  He is a previous patient of ours for a right hip arthroscopy which she did very well with.  Patient has been evaluated for this left knee by another physician in [place] where a previous MRI was done last April and subsequent left knee arthroscopy with meniscectomy was done in late June or early August 2022.  Patient did follow-up with that physician who recommended potential further diagnostic scope for this left knee.  Patient is here for another opinion.  He states that he has pain with activities mainly located to the lateral portion of his left knee.  Rest tends to alleviate his pain.  No recent injections or physical therapy have been done.  He does state that he feels mechanical type of symptoms inside this knee when he walks.  Patient also indicates that his knee swells from time to time.  He occasionally takes anti-inflammatories with little relief.  No other recent treatment or diagnostic testing has been done.  No other major complaints or concerns.    OBJECTIVE:    General: Alert and oriented. No acute distress.  Pulmonary: Non-labored respirations. Normal effort.  GI: Non-tender, non-distended.  C/V: Calves soft and non-tender. DP pulse present  Psych: Normal mood and affect. Normal judgement.    Musculoskeletal Exam: Examination of the patient's left knee reveals well-appearing knee.  No signs of infection.  Skin is intact.  Mild joint effusion is noted today.  Painful but full range of motion in flexion and extension is noted today measuring approximately 0 to 130 degrees.  Calf is supple and nontender.  Negative Homans' sign.  Tenderness to palpation is noted on the medial and lateral joint lines with lateral being greater than medial.  No instability with varus or valgus stress testing is noted today. Positive lateral McMurray's.  Negative Lachman test.  Negative posterior drawer test.  Calf is supple and nontender.  Negative Homans' sign.  Patient is able to straight leg raise without issue today.  Neurovascular status to the left lower extremity is intact.    IMAGING:    Radiographs the patient's left knee were taken at the office today and displayed mild degenerative change of the medial compartment.  No other signs of fracture, dislocation or infection are noted.    ASSESSMENT:  Mild medial OA, left knee  Possible lateral meniscal pathology, left knee    PLAN:  I had a pleasant conversation with the patient concerning his left knee today.  Based on his current symptoms to include mechanical type symptoms in this left knee as well as a new onset pain since he has previous MRI was done I recommend a new MRI to rule out any type of lateral meniscal pathology.  As soon as those results are known to [place] we will relay them onto the patient and plan treatment accordingly.  ----------------------------------------------------------------------------------------------------------------------------------------------------------------------------------------------------------------------------------------------------------------------------------      Review Of Symptoms:  A 14-point review of systems was performed and was positive as below and otherwise negative:  Review of Systems    Allergies:  Adhesive tape (rosins), Celebrex [celecoxib], Latex, Toradol [ketorolac], and Tramadol    Current Medications:  ? diazePAM (VALIUM) 5 mg tablet Take 1 tablet by mouth every 6 hours as needed for Anxiety.   ? dicyclomine (BENTYL) 20 mg tablet Take  one tablet by mouth daily.   ? lansoprazole(+) DR (PREVACID) 15 mg capsule Take one capsule by mouth daily 30 minutes before breakfast.   ? levothyroxine (SYNTHROID) 50 mcg tablet Take one tablet by mouth daily 30 minutes before breakfast.   ? ondansetron (ZOFRAN ODT) 4 mg rapid dissolve tablet Dissolve one tablet by mouth three times daily as needed.   ? oxyCODONE/acetaminophen (ENDOCET) 5/325 mg tablet Take 1 tablet by mouth every 4 hours as needed for Pain       Past Medical History:  Medical History:   Diagnosis Date   ? Gastrointestinal disorder     GERD   ? Thyroid disease        Past Surgical History:  Surgical History:   Procedure Laterality Date   ? HERNIA REPAIR  2002   ? GALLBLADDER SURGERY  2007   ? APPENDECTOMY  2017   ? RIGHT HIP ARTHROSCOPY, HARDWARE REMOVAL, LABRAL REPAIR, ACETABULOPLASTY, FEMOROPLASTY  Right 02/22/2017    Performed by Lowella Curb, MD at [facility]   ? KNEE SURGERY Left 05/2021    removed cyst, cleaned up joint   ? HIP SURGERY Bilateral 2003, 2005       Social History:  Social History     Tobacco Use   Smoking Status Former   ? Packs/day: 0.50   ? Years: 10.00   ? Pack years: 5.00   ? Types: Cigarettes   ? Quit date: 2021   ? Years since quitting: 2.2   ? Passive exposure: Never   Smokeless Tobacco Never   Tobacco Comments    1/3 PPD     Social History     Substance and Sexual Activity   Drug Use No     Social History     Substance and Sexual Activity   Alcohol Use No    Comment: socially       Family History:  Family History   Problem Relation Age of Onset   ? Diabetes Mother    ? Diabetes Maternal Grandmother    ? Diabetes Maternal Grandfather        Vitals:  Vitals:    01/25/22 1343   BP: 110/66   BP Source: Arm, Right Upper   Pulse: 68   Resp: 18   SpO2: 98%   PainSc: Seven   Weight: 129.2 kg (284 lb 12.8 oz)   Height: 177.8 cm (5' 10)     Body mass index is 40.86 kg/m?Marland Kitchen

## 2022-01-25 NOTE — Patient Instructions
J. Paul Schroeppel, MD  Seth Morgan, PA-C  [facility] - Phone [phone] - Fax [phone]   [address], [address] - [place], [place] [address]  Demtrius Rounds, RN - Clinical Nurse Coordinator  Drew Hutchison, ATC - Clinical Athletic Trainer    Please call [phone] for follow up appointments

## 2022-03-18 ENCOUNTER — Encounter: Admit: 2022-03-18 | Discharge: 2022-03-18 | Payer: MEDICAID

## 2022-03-18 NOTE — Telephone Encounter
LVM inquiring as to whether Mitchell Petersen had his left knee MRI completed externally

## 2022-03-19 ENCOUNTER — Encounter: Admit: 2022-03-19 | Discharge: 2022-03-19 | Payer: MEDICAID

## 2022-04-15 ENCOUNTER — Encounter: Admit: 2022-04-15 | Discharge: 2022-04-15 | Payer: MEDICAID

## 2022-04-15 NOTE — Telephone Encounter
LVM inquiring as to whether Mitchell Petersen had his left knee MRI completed externally.  2nd attempt

## 2024-08-13 ENCOUNTER — Encounter: Admit: 2024-08-13 | Discharge: 2024-08-13 | Payer: MEDICAID

## 2024-10-22 ENCOUNTER — Emergency Department: Admit: 2024-10-22 | Discharge: 2024-10-22 | Attending: Student in an Organized Health Care Education/Training Program

## 2024-10-22 ENCOUNTER — Encounter: Admit: 2024-10-22 | Discharge: 2024-10-22

## 2024-10-22 DIAGNOSIS — R42 Dizziness and giddiness: Secondary | ICD-10-CM

## 2024-10-22 DIAGNOSIS — R1084 Generalized abdominal pain: Secondary | ICD-10-CM

## 2024-10-22 LAB — POC GLUCOSE: ~~LOC~~ BKR POC GLUCOSE: 126 mg/dL — ABNORMAL HIGH (ref 70–100)

## 2024-10-22 LAB — CBC AND DIFF
~~LOC~~ BKR ABSOLUTE BASO COUNT: 0 10*3/uL (ref 0.00–0.20)
~~LOC~~ BKR ABSOLUTE EOS COUNT: 0 10*3/uL (ref 0.00–0.45)
~~LOC~~ BKR ABSOLUTE LYMPH COUNT: 1.5 10*3/uL (ref 1.00–4.80)
~~LOC~~ BKR ABSOLUTE MONO COUNT: 0.4 10*3/uL (ref 0.00–0.80)
~~LOC~~ BKR ABSOLUTE NEUTROPHIL: 4.9 10*3/uL (ref 1.80–7.00)
~~LOC~~ BKR HEMATOCRIT: 38 % — ABNORMAL LOW (ref 40.0–50.0)
~~LOC~~ BKR MCH: 29 pg — CL (ref 26.0–34.0)
~~LOC~~ BKR MCV: 85 fL — ABNORMAL HIGH (ref 80.0–100.0)
~~LOC~~ BKR MDW (MONOCYTE DISTRIBUTION WIDTH): 17 (ref <=20.6)
~~LOC~~ BKR RBC COUNT: 4.5 10*6/uL — ABNORMAL LOW (ref 4.40–5.50)
~~LOC~~ BKR WBC COUNT: 6.8 10*3/uL (ref 4.50–11.00)

## 2024-10-22 LAB — POC LACTATE: ~~LOC~~ BKR POC LACTIC ACID: 1.1 mmol/L (ref 0.5–2.0)

## 2024-10-22 LAB — POC CREATININE: ~~LOC~~ BKR POC CREATININE: 0.9 mg/dL (ref 0.4–1.24)

## 2024-10-22 MED ORDER — SODIUM PHOSPHATE IVPB
30 MMOL | Freq: Once | INTRAVENOUS | 0 refills | Status: DC
Start: 2024-10-22 — End: 2024-10-23

## 2024-10-22 MED ORDER — ONDANSETRON 4 MG PO TBDI
4 mg | ORAL | 0 refills | Status: DC | PRN
Start: 2024-10-22 — End: 2024-10-25

## 2024-10-22 MED ORDER — IOHEXOL 350 MG IODINE/ML IV SOLN
100 mL | Freq: Once | INTRAVENOUS | 0 refills | Status: CP
Start: 2024-10-22 — End: ?
  Administered 2024-10-23: 100 mL via INTRAVENOUS

## 2024-10-22 MED ORDER — LACTATED RINGERS IV SOLP
INTRAVENOUS | 0 refills | Status: AC
Start: 2024-10-22 — End: ?
  Administered 2024-10-23 – 2024-10-24 (×3): 1000.0000 mL via INTRAVENOUS

## 2024-10-22 MED ORDER — POTASSIUM CHLORIDE 20 MEQ PO TBTQ
40-60 meq | ORAL | 0 refills | Status: DC | PRN
Start: 2024-10-22 — End: 2024-10-23

## 2024-10-22 MED ORDER — POTASSIUM CHLORIDE 20 MEQ/15 ML PO LIQD
40-60 meq | NASOGASTRIC | 0 refills | Status: DC | PRN
Start: 2024-10-22 — End: 2024-10-23

## 2024-10-22 MED ORDER — DICYCLOMINE 10 MG PO CAP
10 mg | Freq: Once | ORAL | 0 refills | Status: CP
Start: 2024-10-22 — End: ?
  Administered 2024-10-23: 04:00:00 10 mg via ORAL

## 2024-10-22 MED ORDER — ENOXAPARIN 40 MG/0.4 ML SC SYRG
40 mg | Freq: Every day | SUBCUTANEOUS | 0 refills | Status: DC
Start: 2024-10-22 — End: 2024-10-23

## 2024-10-22 MED ORDER — FENTANYL CITRATE (PF) 50 MCG/ML IJ SOLN
50 ug | Freq: Once | INTRAVENOUS | 0 refills | Status: CP
Start: 2024-10-22 — End: ?
  Administered 2024-10-23: 02:00:00 50 ug via INTRAVENOUS

## 2024-10-22 MED ORDER — POTASSIUM CHLORIDE IN WATER 10 MEQ/50 ML IV PGBK
10 meq | INTRAVENOUS | 0 refills | Status: DC | PRN
Start: 2024-10-22 — End: 2024-10-23

## 2024-10-22 MED ORDER — LACTATED RINGERS IV BOLUS
1000 mL | Freq: Once | INTRAVENOUS | 0 refills | Status: CP
Start: 2024-10-22 — End: ?
  Administered 2024-10-22: 23:00:00 1000 mL via INTRAVENOUS

## 2024-10-22 MED ORDER — ENOXAPARIN 40 MG/0.4 ML SC SYRG
40 mg | Freq: Two times a day (BID) | SUBCUTANEOUS | 0 refills | Status: DC
Start: 2024-10-22 — End: 2024-10-25
  Administered 2024-10-23 – 2024-10-25 (×5): 40 mg via SUBCUTANEOUS

## 2024-10-22 MED ORDER — MAGNESIUM SULFATE IN D5W 1 GRAM/100 ML IV PGBK
1 g | INTRAVENOUS | 0 refills | Status: DC | PRN
Start: 2024-10-22 — End: 2024-10-23

## 2024-10-22 MED ORDER — ONDANSETRON HCL (PF) 4 MG/2 ML IJ SOLN
4 mg | INTRAVENOUS | 0 refills | Status: DC | PRN
Start: 2024-10-22 — End: 2024-10-25

## 2024-10-22 MED ORDER — FENTANYL CITRATE (PF) 50 MCG/ML IJ SOLN
50 ug | Freq: Once | INTRAVENOUS | 0 refills | Status: CP
Start: 2024-10-22 — End: ?
  Administered 2024-10-23: 05:00:00 50 ug via INTRAVENOUS

## 2024-10-22 MED ORDER — SODIUM CHLORIDE 0.9 % IJ SOLN
50 mL | Freq: Once | INTRAVENOUS | 0 refills | Status: CP
Start: 2024-10-22 — End: ?
  Administered 2024-10-23: 50 mL via INTRAVENOUS

## 2024-10-22 MED ORDER — SODIUM PHOSPHATE IVPB
30 MMOL | Freq: Once | INTRAVENOUS | 0 refills | Status: CP
Start: 2024-10-22 — End: ?
  Administered 2024-10-23 (×2): 30 mmol via INTRAVENOUS

## 2024-10-22 MED ORDER — POLYETHYLENE GLYCOL 3350 17 GRAM PO PWPK
1 | Freq: Every day | ORAL | 0 refills | Status: DC | PRN
Start: 2024-10-22 — End: 2024-10-25

## 2024-10-22 MED ORDER — FENTANYL CITRATE (PF) 50 MCG/ML IJ SOLN
50 ug | Freq: Once | INTRAVENOUS | 0 refills | Status: CP
Start: 2024-10-22 — End: ?
  Administered 2024-10-23: 01:00:00 50 ug via INTRAVENOUS

## 2024-10-22 MED ORDER — MAGNESIUM OXIDE 400 MG (241.3 MG MAGNESIUM) PO TAB
400 mg | ORAL | 0 refills | Status: DC | PRN
Start: 2024-10-22 — End: 2024-10-23

## 2024-10-22 MED ORDER — MELATONIN 5 MG PO TAB
5 mg | Freq: Every evening | ORAL | 0 refills | Status: DC | PRN
Start: 2024-10-22 — End: 2024-10-25
  Administered 2024-10-23: 11:00:00 5 mg via ORAL

## 2024-10-22 MED ORDER — SENNOSIDES-DOCUSATE SODIUM 8.6-50 MG PO TAB
1 | Freq: Every day | ORAL | 0 refills | Status: DC | PRN
Start: 2024-10-22 — End: 2024-10-25

## 2024-10-22 NOTE — ED Provider Notes [19]
 Emergency Department Provider Note    Chief Complaint:  Chief Complaint   Patient presents with    Dizziness     Family member of pt upstairs, started feeling dizziness, LLQ abd pain, AxOx4, VSS, hx of left DVT     History of Present Illness:  Mitchell Petersen is a 38 y.o. male, with a history of IBS, renal stone, DVT who presents to the emergency department for dizziness. Patient was noted to be somewhat of a poor historian regarding the events that led to his presentation at [facility] ED. Patient reports acute onset of generalized abdominal pain upon waking up today (10/22/24) which was accompanied by ~10-15 bowel movements with loose mucosal stool, fever and chills. Patient reports he experienced similar frequent episodes of diarrhea ~1 month ago when he was admitted at an outside facility for a DVT in his LLE. Patient reports his previous episodes diarrhea ultimately resolved after he was treated with antibiotics. Patient reports he was not started on any anticoagulation medications at the time of discharge from his recent hospitalization but was not able to further explain the plans that were made for further management. Patient reports the pain in his LLE has continued to persist since his recent hospitalization. Patient reports he is complaint with his previously prescribed dicyclomine . Patient denies alcohol or illicit drug use.    Per chart review, patient was visiting his wife earlier today who is currently admitted at [facility]. A rapid response was ultimately initiated after the patient endorsed dizziness and nausea to staff members in addition to his previously ongoing diarrhea and LLE pain, with concurrent abnormal behavior being observed by staff members.      History provided by:  Patient and medical records  History limited by:  Mental status change  Dizziness  Associated symptoms include abdominal pain. Pertinent negatives include no chest pain, no headaches and no shortness of breath.       Review of Systems:  Review of Systems   Constitutional:  Negative for fever.   HENT:  Negative for sore throat.    Eyes:  Negative for visual disturbance.   Respiratory:  Negative for shortness of breath.    Cardiovascular:  Negative for chest pain.   Gastrointestinal:  Positive for abdominal pain and diarrhea. Negative for nausea and vomiting.   Genitourinary:  Negative for dysuria.   Musculoskeletal:  Positive for arthralgias. Negative for back pain.   Skin:  Negative for rash.   Neurological:  Positive for dizziness. Negative for headaches.   Psychiatric/Behavioral:  Positive for confusion.      Pertinent ROS was obtained and non-contributory except as noted in HPI.     Allergies:  Adhesive tape (rosins), Celebrex [celecoxib], Latex, Toradol [ketorolac], and Tramadol    Past Medical History:  Past Medical History:    Gastrointestinal disorder    Thyroid disease     Past Surgical History:  Surgical History:   Procedure Laterality Date    HERNIA REPAIR  2002    GALLBLADDER SURGERY  2007    APPENDECTOMY  2017    RIGHT HIP ARTHROSCOPY, HARDWARE REMOVAL, LABRAL REPAIR, ACETABULOPLASTY, FEMOROPLASTY  Right 02/22/2017    Performed by Dann Norleen Mt, MD at [facility] OR    KNEE SURGERY Left 05/2021    removed cyst, cleaned up joint    HIP SURGERY Bilateral 2003, 2005     Social History:  Social History     Tobacco Use    Smoking status: Former  Current packs/day: 0.00     Average packs/day: 0.5 packs/day for 10.0 years (5.0 ttl pk-yrs)     Types: Cigarettes     Start date: 2011     Quit date: 2021     Years since quitting: 4.9     Passive exposure: Never    Smokeless tobacco: Never    Tobacco comments:     1/3 PPD   Substance Use Topics    Alcohol use: No     Comment: socially    Drug use: No     Family History:  Family History   Problem Relation Name Age of Onset    Diabetes Mother      Diabetes Maternal Grandmother      Diabetes Maternal Grandfather       Pertinent medical/surgical/social/family/allergy history reviewed. Vitals:  ED Vitals      Date and Time T BP P RR SPO2 L User   10/22/24 1612 36.6 ?C (97.9 ?F) 135/95 74 15 PER MINUTE 100 % -- LH   10/22/24 1701 -- 116/78 72 23 PER MINUTE 98 % -- LH   10/22/24 1818 -- -- 61 13 PER MINUTE 99 % -- [facility]   10/22/24 1830 -- 139/70 -- -- -- -- LH   10/22/24 1900 -- 119/74 68 -- 94 % -- LH   10/22/24 1930 -- 118/79 78 14 PER MINUTE 98 % -- IW   10/22/24 2000 -- 128/67 65 -- 99 % -- IW   10/22/24 2140 -- -- 77 17 PER MINUTE 98 % -- IW   10/22/24 2141 -- 107/58 76 -- 98 % -- IW   10/22/24 2200 -- 122/81 61 -- 99 % -- IW          Physical Exam:  Physical Exam  Vitals and nursing note reviewed.   Constitutional:       Appearance: Normal appearance.   HENT:      Head: Normocephalic and atraumatic.      Mouth/Throat:      Mouth: Mucous membranes are dry.   Eyes:      Extraocular Movements: Extraocular movements intact.      Conjunctiva/sclera: Conjunctivae normal.      Comments: Pupils are mildly reactive to light.    Cardiovascular:      Rate and Rhythm: Normal rate and regular rhythm.   Pulmonary:      Effort: Pulmonary effort is normal.      Breath sounds: Normal breath sounds.   Abdominal:      General: Bowel sounds are normal. There is no distension.      Palpations: Abdomen is soft.      Tenderness: There is no abdominal tenderness.   Musculoskeletal:         General: Normal range of motion.      Cervical back: Neck supple.      Left knee: No bony tenderness. Normal range of motion. No tenderness.      Left lower leg: Bony tenderness (Anterior tibia) present.      Comments: BLE are symmetrical is size and color.    Skin:     General: Skin is warm and dry.   Neurological:      Mental Status: He is oriented to person, place, and time. Mental status is at baseline.   Psychiatric:         Mood and Affect: Mood normal.         Behavior: Behavior normal.  Laboratory Results:  Labs Reviewed   CBC AND DIFF - Abnormal       Result Value Ref Range Status    White Blood Cells 6.80  4.50 - 11.00 10*3/uL Final    Red Blood Cells 4.54  4.40 - 5.50 10*6/uL Final    Hemoglobin 13.3 (*) 13.5 - 16.5 g/dL Final    Hematocrit 61.1 (*) 40.0 - 50.0 % Final    MCV 85.5  80.0 - 100.0 fL Final    MCH 29.3  26.0 - 34.0 pg Final    MCHC 34.3  32.0 - 36.0 g/dL Final    RDW 85.4  88.9 - 15.0 % Final    Platelet Count 261  150 - 400 10*3/uL Final    MPV 7.2  7.0 - 11.0 fL Final    Neutrophils 71.8  41.0 - 77.0 % Final    Lymphocytes 21.3 (*) 24.0 - 44.0 % Final    Monocytes 6.3  4.0 - 12.0 % Final    Eosinophils 0.2  0.0 - 5.0 % Final    Basophils 0.4  0.0 - 2.0 % Final    Absolute Neutrophil Count 4.90  1.80 - 7.00 10*3/uL Final    Absolute Lymph Count 1.50  1.00 - 4.80 10*3/uL Final    Absolute Monocyte Count 0.40  0.00 - 0.80 10*3/uL Final    Absolute Eosinophil Count 0.00  0.00 - 0.45 10*3/uL Final    Absolute Basophil Count 0.00  0.00 - 0.20 10*3/uL Final    MDW (Monocyte Distribution Width) 17.3  <=20.6 Final   COMPREHENSIVE METABOLIC PANEL - Abnormal    Sodium 134 (*) 137 - 147 mmol/L Final    Potassium 3.8  3.5 - 5.1 mmol/L Final    Chloride 103  98 - 110 mmol/L Final    Glucose 119 (*) 70 - 100 mg/dL Final    Blood Urea Nitrogen 9  7 - 25 mg/dL Final    Creatinine 9.15  0.40 - 1.24 mg/dL Final    Calcium 9.5  8.5 - 10.6 mg/dL Final    Total Protein 7.6  6.0 - 8.0 g/dL Final    Total Bilirubin 0.6  0.2 - 1.3 mg/dL Final    Albumin 4.4  3.5 - 5.0 g/dL Final    Alk Phosphatase 59  25 - 110 U/L Final    AST 17  7 - 40 U/L Final    ALT 17  7 - 56 U/L Final    CO2 20 (*) 21 - 30 mmol/L Final    Anion Gap 11  3 - 12 Final    Glomerular Filtration Rate (GFR) >60  >60 mL/min Final   PHOSPHORUS - Abnormal    Phosphorus <1.0 (*) 2.0 - 4.5 mg/dL Final   TSH WITH FREE T4 REFLEX - Abnormal    TSH 0.24 (*) 0.35 - 5.00 ?IU/mL Final   BETA HYDROXYBUTYRATE (KETONES) - Abnormal    Beta Hydroxybutyrate 0.6 (*) <0.3 mmol/L Final    Narrative:     Beta hydroxybutyrate (BOHB) is the most abundant ketone (78%), followed by acetoacetate (20%) and acetone (2%).  Measurement of BOHB is recommended to assess ketones in DKA.    Expected BOHB Results for DKA:  - Initial presentation   high/increasing  - During treatment       decreasing  - Resolved               decreasing/normal     URINALYSIS DIPSTICK REFLEX TO CULTURE -  Abnormal    Color,UA Colorless   Final    Turbidity,UA Clear  Clear Final    Specific Gravity-Urine 1.004 (*) 1.005 - 1.030 Final    pH,UA 8.0  5.0 - 8.0 Final    Protein,UA Negative  Negative Final    Glucose,UA Negative  Negative Final    Ketones,UA Trace (*) Negative Final    Bilirubin,UA Negative  Negative Final    Blood,UA Negative  Negative Final    Urobilinogen,UA Normal  Normal Final    Nitrite,UA Negative  Negative Final    Leukocytes,UA Negative  Negative Final   POC GLUCOSE - Abnormal    Glucose, POC 126 (*) 70 - 100 mg/dL Final   POC BLOOD GAS VEN - Abnormal    PH-VEN-POC 7.48 (*) 7.30 - 7.40 Final    PCO2-VEN-POC 29 (*) 36 - 50 mm[Hg] Final    PO2-VEN-POC 29 (*) 33 - 48 mm[Hg] Final    Base Def-VEN-POC 2  mmol/L Final    O2 Sat-VEN-POC 62  55 - 71 % Final    Bicarbonate-VEN- POC 22  mmol/L Final   COVID INFLUENZA A/B AND RSV PCR - Normal    Influenza A Virus Not Detected  Not Detected, Test Invalid Final    Influenza B Virus Not Detected  Not Detected, Test Invalid Final    RSV PCR Not Detected  Not Detected, Test Invalid Final    COVID-19 (SARS-CoV-2) PCR Not Detected  Not Detected Final   LIPASE - Normal    Lipase 19  11 - 82 U/L Final   MAGNESIUM  - Normal    Magnesium  2.0  1.6 - 2.6 mg/dL Final   AMPHETAMINES-URINE RANDOM - Normal    Amphetamines Negative  Negative Final    Narrative:     Results were obtained by immunoassay and are presumptive only. Positive indicates the presence of a substance with characteristics similar to drug-drug class or metabolite in concentration equal to or exceeding the value listed.   Amphetamines Cutoff = 1000 ng/mL      BARBITURATES-URINE RANDOM - Normal Barbiturates,Urine Negative  Negative Final    Narrative:     Results were obtained by immunoassay and are presumptive only. Positive indicates the presence of a substance with characteristics similar to drug-drug class or metabolite in concentration equal to or exceeding the value listed.   Barbiturates Cutoff = 200 ng/mL      BENZODIAZEPINES-URINE RANDOM - Normal    Benzodiazepines Negative  Negative Final    Narrative:     Results were obtained by immunoassay and are presumptive only. Positive indicates the presence of a substance with characteristics similar to drug-drug class or metabolite in concentration equal to or exceeding the value listed.   Benzodiazepines Cutoff = 300 ng/mL     CANNABINOIDS-URINE RANDOM - Normal    THC Negative  Negative Final    Narrative:     Results were obtained by immunoassay and are presumptive only. Positive indicates the presence of a substance with characteristics similar to drug-drug class or metabolite in concentration equal to or exceeding the value listed.   Cannabinoids Cutoff = 50 ng/mL     COCAINE-URINE RANDOM - Normal    Cocaine-Urine Negative  Negative Final    Narrative:     Results were obtained by immunoassay and are presumptive only. Positive indicates the presence of substance a with characteristics similar to drug-drug class or metabolite in concentration equal to or exceeding the value listed.   Cocaine Cuttoff =  300 ng/mL     OPIATES-URINE RANDOM - Normal    Opiates-Urine Negative  Negative Final    Narrative:     Results were obtained by immunoassay and are presumptive only. Positive indicates the presence of a substance with characteristics similar to drug-drug class or metabolite in concentration equal to or exceeding the value listed.   Opiates Cutoff = 2000 ng/mL     PHENCYCLIDINES-URINE RANDOM - Normal    Phencyclidine (PCP) Negative  Negative Final    Narrative:     Results were obtained by immunoassay and are presumptive only. Positive indicates the presence of a substance with characteristics similar to drug-drug class or metabolite in concentration equal to or exceeding the value listed.   Phencyclidine (PCP) Cutoff = 25 ng/mL     FENTANYL  URINE - Normal    Fentanyl  Negative  Negative Final    Narrative:     Results were obtained by immunoassay and are presumptive only. Positive indicates the presence of a substance with characteristics similar to drug-drug class or metabolite in concentration equal to or exceeding the value listed.   Fentanyl  Cutoff = 5 ng/mL     MAGNESIUM  - Normal    Magnesium  1.9  1.6 - 2.6 mg/dL Final   URINALYSIS MICROSCOPIC REFLEX TO CULTURE - Normal    WBCs,UA 0 - 2  None, 0 - 2  /HPF Final    RBCs,UA 0 - 2  None, 0 - 2  /HPF Final    Mucous,UA Trace  None, Trace /LPF Final   FREE T4 (FREE THYROXINE) ONLY - Normal    T4-Free 0.9  0.6 - 1.6 ng/dL Final   POC LACTATE - Normal    LACTIC ACID POC 1.1  0.5 - 2.0 mmol/L Final   POC CREATININE - Normal    Creatinine, POC 0.9  0.4 - 1.24 mg/dL Final   POC HEMATOCRIT&HEMOGLOBIN - Normal    Hemoglobin POC 13.6  13.5 - 16.5 g/dL Final    Hematocrit POC 40  40 - 50 % Final   POC SODIUM - Normal    SODIUM-POC 140  137 - 147 mmol/L Final   POC POTASSIUM - Normal    Potassium-POC 3.9  3.5 - 5.1 mmol/L Final   POC BLOOD GAS VEN     Radiology Interpretation:  CT ABD/PELV W CONTRAST   Final Result      1. Bladder wall thickening and mild pericystic fat stranding, may be due to underdistention, however cystitis is a consideration and recommend correlation with urinalysis.   2. No other acute findings in the abdomen or pelvis.   3. Prior cholecystectomy and hepatic steatosis. No biliary ductal dilatation.          Finalized by Cathaleen JONETTA Race, MD on 10/22/2024 7:15 PM. Dictated by Cathaleen JONETTA Race, MD on 10/22/2024 7:11 PM.        EKG:  ECG Results              ECG 12-LEAD (Final result)        Collection Time Result Time VT RATE P-R Interval QRS DURATION Q-T Interval QTC Calc Bazett P Axis R Axis T Axis    10/23/24 07:42:30 10/24/24 15:18:53 58 182 84 476 467 35 8 29                     Final result                   Impression:  Sinus bradycardia with sinus arrhythmia  Otherwise normal ECG  When compared with ECG of 22-Oct-2024 16:13,  No significant change was found  Confirmed by Ceule, Scott ([id number]) on 10/24/2024 3:18:49 PM                                     [facility] ED MAIN ECG TRIAGE ONLY (Final result)        Collection Time Result Time VT RATE P-R Interval QRS DURATION Q-T Interval QTC Calc Bazett P Axis R Axis T Axis    10/22/24 16:13:43 10/22/24 19:08:18 69 180 92 450 482 38 5 42                     Final result                   Impression:    Normal sinus rhythm  QTcB >= 480 msec  Abnormal ECG  No previous ECGs available in MUSE  Confirmed by Gershon Price [phone]) on 10/22/2024 7:08:17 PM                                    Medical Decision Making:  Renny Crate Daden Mahany is a 38 y.o. male who presents with chief complaint as listed above. Based on the history and presentation, the list of differential diagnoses considered included, but was not limited to, dehydration, occult infection, infectious diarrhea, electrolyte abnormality, substance abuse    On arrival patient is confused, appears dehydrated and ill.  Will provide patient with IV fluids and will obtain broad laboratory studies.  EKG on arrival does demonstrate a long QT and therefore we will avoid QT prolonging agents.  Will obtain CT scan of patient's abdomen as he has a recent history of diarrhea and is now complaining of abdominal pain.  Additionally as patient is uncertain of whether or not he had a DVT in his lower extremity will obtain ultrasound to further assess.    Laboratory studies are remarkable for significant hypophosphatemia.  Patient with low TSH which is consistent with his known thyroid disease.  Additionally patient with mildly elevated beta hydroxybutyrate, suspect secondary to starvation ketosis given patient's reported symptoms.    CT of the abdomen and pelvis as interpreted by me does not demonstrate any acute colonic pathology, no small bowel obstruction, no evidence of infection.  Though CT mentions possibility for cystitis, patient without symptoms and urinalysis is within normal limits.    Given patient's syncopal episode and critical hypophosphatemia which is currently being replaced will admit patient for further workup and evaluation.  At the time of admission patient has improved on exam, is more conversant and reports some improvement in his symptoms.  Patient admitted to the internal medicine service in stable condition.                            Facility Administered Meds:  Medications   lactated ringers  IV bolus 1,000 mL (0 mL Intravenous Infusion Stopped 10/22/24 1735)   fentaNYL  citrate PF (SUBLIMAZE ) injection 50 mcg (50 mcg Intravenous Given 10/22/24 1846)   iohexoL  (OMNIPAQUE -350) 350 mg/mL injection 100 mL (100 mL Intravenous Given 10/22/24 1824)   sodium chloride  PF 0.9% injection 50 mL (50 mL Intravenous Given 10/22/24 1824)   sodium phosphate  30  mmol in dextrose  5% (D5) 260 mL IVPB (30 mmol Intravenous Given - New Bag 10/22/24 1927)   fentaNYL  citrate PF (SUBLIMAZE ) injection 50 mcg (50 mcg Intravenous Given 10/22/24 1946)   dicyclomine  (BENTYL ) capsule 10 mg (10 mg Oral Given 10/22/24 2150)     Coding:  Additional data reviewed:  History was obtained from an independent historian: Not in addition to what is mentioned above  Prior non-ED notes reviewed: Recent discharge summary and/or H&P  Independent interpretation of diagnostic tests was performed by me: Not in addition to what is mentioned above  Patient presentation/management was discussed with the following qualified health care professionals and/or other relevant professionals: Admitting Physician    Risk evaluation:  Diagnosis or treatment of patient condition impacted by social determinant of health: Problems related to psychosocial circumstances  Tests Considered but not performed due to clinical scoring (if not mentioned in ED course, aside from what is implied by clinical scores listed): N/A  Rationale regarding whether admission or escalation of care considered if not performed (if not mentioned in ED course, aside from what is implied by clinical scores listed): N/A    Clinical Impression and COPA:  Patient's active diagnoses as well as contributing pre-existing medical problems include:  Clinical Impression   Dizziness   Dehydration   Hypophosphatemia   Generalized abdominal pain     Disposition/Follow up  ED Disposition     ED Disposition   Admit         No follow-up provider specified.  Discharge Medication List as of 10/25/2024  3:25 PM        START taking these medications    Details   azithromycin  (ZITHROMAX ) 500 mg tablet Take one tablet by mouth daily for 1 day. Indications: enteropathogenic ecoli, Disp-1 tablet, R-0, Normal      ERGOcalciferoL  (vitamin D2) (DRISDOL ) 1,250 mcg (50,000 unit) capsule Take one capsule by mouth every 7 days. Indications: low vitamin D  levels, Disp-12 capsule, R-0, Normal      hyoscyamine  (ANASPAZ ) 0.125 mg rapid dissolve tablet Place one tablet under tongue every 4 hours as needed. Indications: irritable colon, Disp-30 tablet, R-0, Normal      ondansetron  (ZOFRAN  ODT) 4 mg rapid dissolve tablet Dissolve one tablet by mouth every 6 hours as needed. Place on tongue to dissolve.  Indications: nausea, Disp-30 tablet, R-0, Normal      oxyCODONE  (ROXICODONE ) 5 mg tablet Take one tablet by mouth every 4 hours as needed. Indications: pain, Disp-8 tablet, R-0, Normal      pantoprazole  DR (PROTONIX ) 40 mg tablet Take one tablet by mouth daily. Indications: gastroesophageal reflux disease, Disp-90 tablet, R-0, Normal      polyethylene glycol 3350  (MIRALAX ) 17 g packet Take one packet by mouth daily as needed (Constipation - Second Line). Indications: constipation, OTC      sennosides-docusate sodium  (SENOKOT-S) 8.6/50 mg tablet Take one tablet by mouth daily as needed (Constipation - First Line). Indications: constipation, OTC           Procedure Notes:  Procedures     Attestation:  I, Rece Buckmaster, am scribing for and in the presence of Shelba GEANNIE Fila, MD.     Rece Buckmaster     Note has been documented by Sharia Starks on 10/22/2024    Attestation / Supervision Note concerning [facility]: I personally performed the E/M including history, physical exam, and MDM.    Shelba LOISE Fila, MD

## 2024-10-22 NOTE — H&P [4]
 Admission History and Physical    Name:  Mitchell Petersen                                                     MRN:  [id number]   Admission Date:  10/22/2024  Admission Diagnosis: Hypophosphatemia [E83.39]     Principal Problem:    Hypophosphatemia      Assessment/Plan:       Dekendrick Uzelac is a 38 y.o. male with PMH of Pancreatitis, GERD presenting with episode of AMS and near syncope.     # Hypophosphatemia  # Confusion  # Weakness   # Near Syncope  - Patient developed episode while visiting his wife in the ICU, became very weak and endorse he does not remember what happened but per report had episode of near syncope  - Labs: Phosphorus <1.0, UDS -ve, no leukocytosis, TSH 0.24 with normal T4.   - I believe his symptoms of confusion and syncope are related to hypophosphatemia which could be related to diarrhea.   -Admit to tele  - Aggressive replacement, Recheck in the AM  -Obtain echo   -Obtain orthostatic vitals  -PT/OT   - Obtain Vitamin D    - IVFs 125cc/hr     # Diarrhea  # Abdominal pain   - Endorse Abdominal pain today, Diarrhea x 15 episodes water  stools  - CTAP: 1. Bladder wall thickening and mild pericystic fat stranding, may be due to underdistention, however cystitis is a consideration and recommend correlation with urinalysis. 2. No other acute findings in the abdomen or pelvis. 3. Prior cholecystectomy and hepatic steatosis. No biliary ductal dilatation.   - Obtain C. Diff  - Obtain Stool cultures   - IV fentanyl  for pain    # LLE Tenderness   - Doppler US  done, no evidence of acute DVT     VTE PPX     Dispo  - Admit to Floor      Code Status: Full code (Code status discussed with patient at bedside, patient wants all resuscitative measures including CPR and Intubation if needed in case of cardiac arrest or if needed.)      Total Time Today was 75 minutes in the following activities: Preparing to see the patient, Obtaining and/or reviewing separately obtained history, Performing a medically appropriate examination and/or evaluation, Counseling and educating the patient/family/caregiver, Ordering medications, tests, or procedures, Documenting clinical information in the electronic or other health record, Independently interpreting results (not separately reported) and communicating results to the patient/family/caregiver and Care coordination (not separately reported)        Dominique Guild, MD  Internal Medicine/Nocturnist  8 PM - 8 AM  Pager # [phone]    Note: For any questions/concerns after 8 AM; please contact the assigned team. Voalte is the preferred way of communication.         Subjective:                       Primary Care Physician: No Pcp, Na      Chief Complaint:  Chief Complaint   Patient presents with    Dizziness     Family member of pt upstairs, started feeling dizziness, LLQ abd pain, AxOx4, VSS, hx of left DVT       HPI: Mitchell Petersen  is a 38 y.o. male with PMH of Pancreatitis, GERD presenting with episode of AMS and near syncope.     He experienced a dizzy, near syncopal event while visiting his wife in the hospital, which led to confusion and diaphoresis.He reports having approximately 20 bouts of diarrhea over the past two days, with 10-15 episodes occurring yesterday. The diarrhea was watery, without blood, and was accompanied by severe abdominal pain that began concurrently with the other symptoms. He also experienced nausea and vomited once yesterday    He has a history of hospitalization in November for keloids and leg swelling, which was treated at [facility]. He recalls being told about a possible blood clot in his left leg but is uncertain about the details.    He feels feverish with hot and cold sweats but did not measure his temperature. No chest pain, shortness of breath, or palpitations. He also notes frequent urination without blood in the urine.    He quit drinking alcohol nearly nine years ago and currently only takes Tylenol  for headaches.    Past Medical History:    Gastrointestinal disorder    Thyroid disease        Surgical History:   Procedure Laterality Date    HERNIA REPAIR  2002    GALLBLADDER SURGERY  2007    APPENDECTOMY  2017    RIGHT HIP ARTHROSCOPY, HARDWARE REMOVAL, LABRAL REPAIR, ACETABULOPLASTY, FEMOROPLASTY  Right 02/22/2017    Performed by Dann Norleen Mt, MD at [facility] OR    KNEE SURGERY Left 05/2021    removed cyst, cleaned up joint    HIP SURGERY Bilateral 2003, 2005     Family History   Problem Relation Name Age of Onset    Diabetes Mother      Diabetes Maternal Grandmother      Diabetes Maternal Grandfather              Social History[1]       Allergies[2]     Review of Systems    Review of system was performed and is negative except for what is mentioned in HPI above.       Objective:      Vital Signs: Last Filed In 24 Hours Vital Signs: 24 Hour Range   BP: 107/58 (12/22 2141)  Temp: 36.6 ?C (97.9 ?F) (12/22 1612)  Pulse: 76 (12/22 2141)  Respirations: 17 PER MINUTE (12/22 2140)  SpO2: 98 % (12/22 2141)  O2 Device: None (Room air) (12/22 1612)  Height: 177.8 cm (5' 10) (12/22 1612) BP: (107-151)/(58-98)   Temp:  [36.6 ?C (97.9 ?F)]   Pulse:  [61-89]   Respirations:  [13 PER MINUTE-23 PER MINUTE]   SpO2:  [94 %-100 %]   O2 Device: None (Room air)     PHYSICAL EXAMINATION:  General Appearance: Cooperative, NAD  Head: NC/AT  Neck: no JVD; no LAD  Eyes: No scleral icterus  Lungs: CTAB  Heart: RRR, normal S1,S2  Abdomen: Soft, abdominal tenderness, +BS  MSK:  no pitting edema, no joint swlling  Skin: No rash or ulcers  Neurologic: Alert and oriented x 4       LABS:  Recent Labs     10/22/24  1614 10/22/24  1715 10/22/24  1815   NA  --  134*  --    K  --  3.8  --    CL  --  103  --    CO2  --  20*  --  GAP  --  11  --    BUN  --  9  --    CR 0.9 0.84  --    GLU  --  119*  --    CA  --  9.5  --    ALBUMIN  --  4.4  --    MG  --  2.0 1.9   PO4  --  <1.0*  --    TSH  --   --  0.24*       Recent Labs     10/22/24  1715 WBC 6.80   HGB 13.3*   HCT 38.8*   PLTCT 261   AST 17   ALT 17   ALKPHOS 59      Estimated Creatinine Clearance: 165.2 mL/min (by C-G formula based on SCr of 0.84 mg/dL).  Vitals:    10/22/24 1612   Weight: 133 kg (293 lb 3.4 oz)    No results for input(s): PHART, PO2ART in the last 72 hours.    Invalid input(s): PC02A                       MEDSsodium phosphate, 30 mmol, Intravenous, ONCE     IV MEDS  Prn      HOME MEDS Medications Ordered Prior to Encounter[3]       CT ABD/PELV W CONTRAST  Result Date: 10/22/2024  1. Bladder wall thickening and mild pericystic fat stranding, may be due to underdistention, however cystitis is a consideration and recommend correlation with urinalysis. 2. No other acute findings in the abdomen or pelvis. 3. Prior cholecystectomy and hepatic steatosis. No biliary ductal dilatation.  Finalized by Cathaleen JONETTA Race, MD on 10/22/2024 7:15 PM. Dictated by Cathaleen JONETTA Race, MD on 10/22/2024 7:11 PM.                 [1]   Social History  Socioeconomic History    Marital status: Married   Tobacco Use    Smoking status: Former     Current packs/day: 0.00     Average packs/day: 0.5 packs/day for 10.0 years (5.0 ttl pk-yrs)     Types: Cigarettes     Start date: 2011     Quit date: 2021     Years since quitting: 4.9     Passive exposure: Never    Smokeless tobacco: Never    Tobacco comments:     1/3 PPD   Substance and Sexual Activity    Alcohol use: No     Comment: socially    Drug use: No   [2]   Allergies  Allergen Reactions    Adhesive Tape (Rosins) HIVES    Celebrex [Celecoxib] HIVES    Latex HIVES    Toradol [Ketorolac] HIVES    Tramadol HIVES   [3]   No current facility-administered medications on file prior to encounter.     Current Outpatient Medications on File Prior to Encounter   Medication Sig Dispense Refill    diazePAM  (VALIUM ) 5 mg tablet Take 1 tablet by mouth every 6 hours as needed for Anxiety. 70 tablet 0    dicyclomine  (BENTYL ) 20 mg tablet Take one tablet by mouth daily.      lansoprazole(+) DR (PREVACID) 15 mg capsule Take one capsule by mouth daily 30 minutes before breakfast.      levothyroxine (SYNTHROID) 50 mcg tablet Take one tablet by mouth daily 30 minutes before breakfast.      ondansetron  (ZOFRAN   ODT) 4 mg rapid dissolve tablet Dissolve one tablet by mouth three times daily as needed.      oxyCODONE /acetaminophen  (ENDOCET) 5/325 mg tablet Take 1 tablet by mouth every 4 hours as needed for Pain 70 tablet 0

## 2024-10-22 NOTE — ED Notes [6]
 38 y/o male presents to ED 55 via rapid response from [facility] while visiting his wife. Per rapid team pt endorsed dizziness, LLQ abd pain, left leg pain, and 20-30 bowel movements a day. When they suggested checking into the ED, pt was unable to stand for them and they were having trouble keeping him alert so they RR him to ED. Upon arrival to ED, vitals stable. Pt is AxOx4. Pt denies recreational drug use although pt exhibiting abnormal behavior. Pt says 8/10 pain in LLQ abd and left leg where he previously was admitted for DVT. Pt says he recently was diagnosed with IBD. Pt resting in bed, respirations even and non-labored. Skin is warm, dry, intact and appropriate for ethnicity. Pt hooked up to monitors. Bed is in lowest locked position, call light within reach. Pt assumes responsibility of belongings.    Past Medical History:    Gastrointestinal disorder    Thyroid disease

## 2024-10-22 NOTE — Progress Notes [1]
 Rapid Response Team Progress Note    Date: 10/22/2024 Time: 1542  Patient: Mitchell Petersen  Attending: No att. providers found Service: Emergency Medicine  Admission Date: 10/22/2024  LOS: 0 days    A Code/Rapid Response Timeline Event Report has been created for this patient on 12/22 at 1542. This rapid response took place on [facility] while patient was visiting his wife who is currently a patient in [id number]. Song reports dizziness and chills, abdominal pain, and 18-20 BM today. He reports he has been drinking fluids but has been very nauseous. Vitals all stable. Patient is lethargic and falling asleep while answering questions. Able to state name, repeating over and over that he needs his phone. Patient agrees to go to ED for evaluation, unable to stand at this time and slumping over on couch. Patient assisted x2 max assist to a cart and taken to ED where handed off to ED team.       Kayleana Waites, RN

## 2024-10-23 ENCOUNTER — Inpatient Hospital Stay: Admit: 2024-10-23 | Discharge: 2024-10-23

## 2024-10-23 LAB — CBC AND DIFF
~~LOC~~ BKR HEMATOCRIT: 35 % — ABNORMAL LOW (ref 40.0–50.0)
~~LOC~~ BKR MCH: 30 pg (ref 26.0–34.0)
~~LOC~~ BKR MCHC: 35 g/dL (ref 32.0–36.0)
~~LOC~~ BKR RBC COUNT: 4.1 10*6/uL — ABNORMAL LOW (ref 4.40–5.50)
~~LOC~~ BKR RDW: 13 % — ABNORMAL LOW (ref 11.0–15.0)
~~LOC~~ BKR WBC COUNT: 8.5 10*3/uL (ref 4.50–11.00)

## 2024-10-23 LAB — 2D + DOPPLER ECHO
AORTIC VALVE STROKE VOLUME INDEX: 38
AV INDEX (NATIVE): 0.8
AV PEAK GRADIENT: 9 mmHg
AV PEAK VELOCITY: 1.5 m/s
BSA: 2.5 m2
DOP CALC LVOT AREA: 3.4 cm2
DOP CALC LVOT DIAMETER: 2.1 cm
DOP CALC LVOT PEAK VEL VTI: 26 cm
DOP CALC LVOT PEAK VEL: 1.2 m/s
DOP CALC LVOT STROKE VOLUME: 93 cm3
E WAVE DECELARTION TIME: 216 ms
E/A RATIO: 1.9
EJECTION FRACTION: 45 %
FRACTIONAL SHORTENING: 26 % (ref 28–44)
INTERVENTRICULAR SEPTUM: 0.7 cm (ref 0.6–1)
LATERAL E/E' RATIO: 7.3
LEFT ATRIUM INDEX: 23 mL/m2 (ref 16–34)
LEFT ATRIUM SIZE: 4.3 cm (ref 3–4)
LEFT ATRIUM VOLUME: 58 mL (ref 18–58)
LEFT INTERNAL DIMENSION IN SYSTOLE: 3.3 cm (ref 2.5–4)
LEFT VENTRICLE DIASTOLIC VOLUME INDEX: 64 mL/m2 (ref 34–74)
LEFT VENTRICLE DIASTOLIC VOLUME: 161 mL (ref 62–150)
LEFT VENTRICLE MASS INDEX: 38 g/m2 (ref 49–115)
LEFT VENTRICLE SYSTOLIC VOLUME INDEX: 22 mL/m2 (ref 11–31)
LEFT VENTRICLE SYSTOLIC VOLUME: 56 mL — ABNORMAL LOW (ref 21–61)
LEFT VENTRICULAR INTERNAL DIMENSION IN DIASTOLE: 4.5 cm (ref 4.2–5.8)
LEFT VENTRICULAR MASS: 96 g (ref 88–224)
MEDIAL E/E' RATIO: 7.9
MV PEAK A VEL: 0.5 m/s
MV PEAK E VEL PW: 1 m/s
POSTERIOR WALL: 0.7 cm (ref 0.6–1)
PROX AORTA: 3 cm (ref 2.6–3.8)
RA PRESSURE: 3
RELATIVE WALL THICKNESS: 0.3 (ref <=0.42)
RIGHT ATRIAL AREA: 15 cm2 (ref <18)
RIGHT HEART SYSTOLIC MMODE TAPSE: 2.3 cm (ref >1.7)
RIGHT HEART SYSTOLIC TDI S': 0.1 m/s
RIGHT VENTRICULAR BASAL DIAMETER: 3.8 cm (ref 2.5–4.1)
RIGHT VENTRICULAR MID DIAMETER: 3.8 cm (ref 1.9–3.5)
SIMPSONS BIPLANE EF: 65 %
SINUS: 3.4 cm (ref 2.8–4)
TDI LATERAL E': 0.1 m/s
TDI MEDIAL E': 0.1 m/s

## 2024-10-23 LAB — COMPREHENSIVE METABOLIC PANEL
~~LOC~~ BKR ALBUMIN: 4.4 g/dL (ref 3.5–5.0)
~~LOC~~ BKR ALK PHOSPHATASE: 59 U/L — ABNORMAL LOW (ref 25–110)
~~LOC~~ BKR ALT: 17 U/L (ref 7–56)
~~LOC~~ BKR AST: 17 U/L (ref 7–40)
~~LOC~~ BKR CALCIUM: 9.5 mg/dL (ref 8.5–10.6)
~~LOC~~ BKR CO2: 20 mmol/L — ABNORMAL LOW (ref 21–30)
~~LOC~~ BKR CREATININE: 0.8 mg/dL (ref 0.40–1.24)
~~LOC~~ BKR POTASSIUM: 3.8 mmol/L — ABNORMAL LOW (ref 3.5–5.1)
~~LOC~~ BKR SODIUM, SERUM: 137 mmol/L — ABNORMAL LOW (ref 137–147)
~~LOC~~ BKR TOTAL BILIRUBIN: 0.6 mg/dL (ref 0.2–1.3)
~~LOC~~ BKR TOTAL PROTEIN: 7.6 g/dL (ref 6.0–8.0)

## 2024-10-23 LAB — URINALYSIS DIPSTICK REFLEX TO CULTURE
~~LOC~~ BKR GLUCOSE,UA: NEGATIVE
~~LOC~~ BKR LEUKOCYTES: NEGATIVE
~~LOC~~ BKR NITRITE: NEGATIVE
~~LOC~~ BKR URINE BILE: NEGATIVE
~~LOC~~ BKR URINE BLOOD: NEGATIVE

## 2024-10-23 LAB — POC BLOOD GAS VEN
~~LOC~~ BKR POC BASE DEF, VEN: 2 mmol/L
~~LOC~~ BKR POC BICARB, VEN: 22 mmol/L (ref 40–50)
~~LOC~~ BKR POC CO2, VEN: 29 mmHg — ABNORMAL LOW (ref 36–50)
~~LOC~~ BKR POC O2 SAT, VEN: 62 % (ref 55–71)
~~LOC~~ BKR POC O2, VEN: 29 mmHg — ABNORMAL LOW (ref 33–48)
~~LOC~~ BKR POC PH, VEN: 7.4 — ABNORMAL HIGH (ref 7.30–7.40)

## 2024-10-23 LAB — BARBITURATES-URINE RANDOM: ~~LOC~~ BKR BARBITURATES: NEGATIVE

## 2024-10-23 LAB — MAGNESIUM
~~LOC~~ BKR MAGNESIUM: 1.9 mg/dL (ref 1.6–2.6)
~~LOC~~ BKR MAGNESIUM: 1.9 mg/dL (ref 1.6–2.6)

## 2024-10-23 LAB — BENZODIAZEPINES-URINE RANDOM: ~~LOC~~ BKR BENZODIAZEPINES: NEGATIVE

## 2024-10-23 LAB — URINALYSIS MICROSCOPIC REFLEX TO CULTURE
~~LOC~~ BKR RBC, UA: 0 /HPF (ref 5.0–8.0)
~~LOC~~ BKR WBC, UA: 0 /HPF — ABNORMAL LOW (ref 1.005–1.030)

## 2024-10-23 LAB — POC GLUCOSE: ~~LOC~~ BKR POC GLUCOSE: 147 mg/dL — ABNORMAL HIGH (ref 70–100)

## 2024-10-23 LAB — BETA HYDROXYBUTYRATE (KETONES): ~~LOC~~ BKR BETA HYDROXYBUTYRATE: 0.6 mmol/L — ABNORMAL HIGH (ref <0.3)

## 2024-10-23 LAB — ECG 12-LEAD
P-R INTERVAL: 178 ms (ref 137–147)
QRS DURATION: 82 ms (ref 3.5–5.1)
VENTRICULAR RATE: 53 {beats}/min

## 2024-10-23 LAB — KC ED MAIN ECG TRIAGE ONLY
P AXIS: 38 degrees
P-R INTERVAL: 180 ms
Q-T INTERVAL: 450 ms
QRS DURATION: 92 ms
QTC CALCULATION (BAZETT): 482 ms
R AXIS: 5 degrees
T AXIS: 42 degrees
VENTRICULAR RATE: 69 {beats}/min

## 2024-10-23 LAB — COCAINE-URINE RANDOM: ~~LOC~~ BKR COCAINE: NEGATIVE

## 2024-10-23 LAB — TSH WITH FREE T4 REFLEX: ~~LOC~~ BKR TSH: 0.2 [IU]/mL — ABNORMAL LOW (ref 0.35–5.00)

## 2024-10-23 LAB — POC POTASSIUM: ~~LOC~~ BKR POC POTASSIUM: 3.9 mmol/L (ref 3.5–5.1)

## 2024-10-23 LAB — COVID INFLUENZA A/B AND RSV PCR

## 2024-10-23 LAB — CANNABINOIDS-URINE RANDOM: ~~LOC~~ BKR CANNABINOIDS (THC): NEGATIVE

## 2024-10-23 LAB — OPIATES-URINE RANDOM: ~~LOC~~ BKR OPIATES: NEGATIVE

## 2024-10-23 LAB — PHENCYCLIDINES-URINE RANDOM: ~~LOC~~ BKR PHENCYCLIDINE (PCP): NEGATIVE

## 2024-10-23 LAB — POC SODIUM: ~~LOC~~ BKR POC SODIUM: 140 mmol/L (ref 137–147)

## 2024-10-23 LAB — FENTANYL URINE: ~~LOC~~ BKR FENTANYL URINE: NEGATIVE

## 2024-10-23 LAB — FREE T4 (FREE THYROXINE) ONLY: ~~LOC~~ BKR FREE T4: 0.9 ng/dL (ref 0.6–1.6)

## 2024-10-23 LAB — AMPHETAMINES-URINE RANDOM: ~~LOC~~ BKR AMPHETAMINES: NEGATIVE

## 2024-10-23 MED ORDER — POTASSIUM CHLORIDE 20 MEQ PO TBTQ
40 meq | ORAL | 0 refills | Status: CP
Start: 2024-10-23 — End: ?
  Administered 2024-10-23 (×2): 40 meq via ORAL

## 2024-10-23 MED ORDER — OXYCODONE 5 MG PO TAB
5 mg | ORAL | 0 refills | Status: DC | PRN
Start: 2024-10-23 — End: 2024-10-25
  Administered 2024-10-23 – 2024-10-25 (×9): 5 mg via ORAL

## 2024-10-23 MED ORDER — ALUM-MAG HYDROXIDE-SIMETH 200-200-20 MG/5 ML PO SUSP
30 mL | Freq: Four times a day (QID) | ORAL | 0 refills | Status: DC | PRN
Start: 2024-10-23 — End: 2024-10-25
  Administered 2024-10-23: 23:00:00 30 mL via ORAL

## 2024-10-23 MED ORDER — AMITRIPTYLINE(#)/GABAPENTIN/BACLOFEN 2/6/2% TOPICAL CRM
TOPICAL | 0 refills | Status: DC
Start: 2024-10-23 — End: 2024-10-25
  Administered 2024-10-23 – 2024-10-24 (×2): via TOPICAL

## 2024-10-23 MED ORDER — PERFLUTREN LIPID MICROSPHERES 1.1 MG/ML IV SUSP
1-10 mL | Freq: Once | INTRAVENOUS | 0 refills | Status: CP | PRN
Start: 2024-10-23 — End: ?
  Administered 2024-10-23: 20:00:00 3 mL via INTRAVENOUS

## 2024-10-23 MED ORDER — ALPRAZOLAM 0.5 MG PO TAB
.25 mg | Freq: Once | ORAL | 0 refills | Status: CP
Start: 2024-10-23 — End: ?
  Administered 2024-10-23: 12:00:00 0.25 mg via ORAL

## 2024-10-23 MED ORDER — ACETAMINOPHEN 500 MG PO TAB
1000 mg | ORAL | 0 refills | Status: DC
Start: 2024-10-23 — End: 2024-10-25
  Administered 2024-10-23 – 2024-10-25 (×8): 1000 mg via ORAL

## 2024-10-23 MED ORDER — SODIUM CHLORIDE 0.9 % IJ SOLN
10 mL | Freq: Once | INTRAVENOUS | 0 refills | Status: CP
Start: 2024-10-23 — End: ?
  Administered 2024-10-23: 20:00:00 10 mL via INTRAVENOUS

## 2024-10-23 MED ORDER — HYDROMORPHONE (PF) 1 MG/ML IJ SOLN
1 mg | INTRAVENOUS | 0 refills | Status: DC | PRN
Start: 2024-10-23 — End: 2024-10-25
  Administered 2024-10-24 – 2024-10-25 (×9): 1 mg via INTRAVENOUS

## 2024-10-23 MED ORDER — MORPHINE 2 MG/ML IV SYRG
2 mg | Freq: Once | INTRAVENOUS | 0 refills | Status: CP
Start: 2024-10-23 — End: ?
  Administered 2024-10-23: 12:00:00 2 mg via INTRAVENOUS

## 2024-10-23 MED ORDER — ERGOCALCIFEROL (VITAMIN D2) 1,250 MCG (50,000 UNIT) PO CAP
50000 [IU] | ORAL | 0 refills | Status: DC
Start: 2024-10-23 — End: 2024-10-25
  Administered 2024-10-24: 16:00:00 50000 [IU] via ORAL

## 2024-10-23 MED ORDER — FENTANYL CITRATE (PF) 50 MCG/ML IJ SOLN
25 ug | INTRAVENOUS | 0 refills | Status: DC | PRN
Start: 2024-10-23 — End: 2024-10-23

## 2024-10-23 MED ORDER — DICYCLOMINE 10 MG PO CAP
10 mg | Freq: Before meals | ORAL | 0 refills | Status: DC
Start: 2024-10-23 — End: 2024-10-25
  Administered 2024-10-24 – 2024-10-25 (×7): 10 mg via ORAL

## 2024-10-23 MED ORDER — MORPHINE 2 MG/ML IV SYRG
2 mg | INTRAVENOUS | 0 refills | Status: DC | PRN
Start: 2024-10-23 — End: 2024-10-23
  Administered 2024-10-23 (×5): 2 mg via INTRAVENOUS

## 2024-10-23 MED ORDER — HYDROXYZINE HCL 25 MG PO TAB
25 mg | Freq: Once | ORAL | 0 refills | Status: CP
Start: 2024-10-23 — End: ?
  Administered 2024-10-23: 09:00:00 25 mg via ORAL

## 2024-10-23 MED ORDER — LIDOCAINE 5 % TP PTMD
2 | Freq: Every evening | TOPICAL | 0 refills | Status: DC
Start: 2024-10-23 — End: 2024-10-25

## 2024-10-23 MED ORDER — LIDOCAINE HCL 2 % MM SOLN
15 mL | Freq: Four times a day (QID) | ORAL | 0 refills | Status: DC | PRN
Start: 2024-10-23 — End: 2024-10-25
  Administered 2024-10-23: 23:00:00 15 mL via ORAL

## 2024-10-23 MED ORDER — PANTOPRAZOLE 40 MG PO TBEC
40 mg | Freq: Every day | ORAL | 0 refills | Status: DC
Start: 2024-10-23 — End: 2024-10-25
  Administered 2024-10-23 – 2024-10-25 (×3): 40 mg via ORAL

## 2024-10-23 MED ORDER — MORPHINE 2 MG/ML IV SYRG
1 mg | Freq: Once | INTRAVENOUS | 0 refills | Status: CP
Start: 2024-10-23 — End: ?
  Administered 2024-10-23: 09:00:00 1 mg via INTRAVENOUS

## 2024-10-23 MED ORDER — ACETAMINOPHEN 325 MG PO TAB
650 mg | ORAL | 0 refills | Status: DC | PRN
Start: 2024-10-23 — End: 2024-10-23

## 2024-10-23 NOTE — ED Notes [6]
 Reached out to Chaplain per patient request.

## 2024-10-23 NOTE — Progress Notes [1]
 General Progress Note    Name:  Mitchell Petersen   Unijb'd Date:  10/23/2024  Admission Date: 10/22/2024  LOS: 0 days                    Principal Problem:    Hypophosphatemia           Assessment/Plan:      Mitchell Petersen is 53M former smoker w/ pancreatitis, GERD, who presented to the ED 12/22 with dizziness and near syncope, disorientation/confusion, diaphoresis in setting of diarrhea, abd pain, n/v and poor po intake. On arrival, pt hemodynamically stable while labs showed severely low phosphorus; ct a/p w cont showed no acute findings. Pt admitted for further mgmt.       # Hypophosphatemia  Hypokalemia  # Confusion, resolved  # Weakness   # Near Syncope  - Patient developed episode while visiting his wife in the ICU, became very weak and endorse he does not remember what happened but per report had episode of near syncope. This is in association w/ diarrhea, abd discomfort, poor po intake  - Labs: Phosphorus <1.0, UDS -ve, no leukocytosis, TSH 0.24 with normal T4.   - sx likely due to profound electrolyte disturbance (undetectable phosphorus)  - 12/23 TTE:    He left ventricular size, wall thickness and systolic function are normal. The ejection fraction by Simpson's biplane method is 65%. There are no segmental wall motion abnormalities.    The right ventricle is probably normal in size. The right ventricular systolic function is probably normal.    Normal biatrial size.    No significant valvular abnormalities.    No pericardial effusion.  Plan:  - IVFs 125cc/hr   - Aggressive electrolyte replacement  -Obtain orthostatic vitals- PENDING  - WILL MONITOR POC BG ACHS TO MONITOR FOR HYPOGLYCEMIA  -PT/OT     # Diarrhea  # Abdominal pain/ poor po intake  Hx IBS  - Endorse Abdominal pain today, Diarrhea x 15 episodes water  stools. Reports hx of GERD and IBS (usually has trouble w/ constipation)- however, these sx have been well controlled for a long while as he stopped drinking and made adjustments to his diet  - CTAP:   1. Bladder wall thickening and mild pericystic fat stranding, may be due to underdistention, however cystitis is a consideration and recommend correlation with urinalysis.   2. No other acute findings in the abdomen or pelvis.   3. Prior cholecystectomy and hepatic steatosis. No biliary ductal dilatation.   - c.diff negative  Plan:  - stool cx, shiga toxins- PENDING  - ALSO CHECKING GI PANEL  - CONSULTING GI  - CHANGED TO SCHEDULED TYLENOL , GI COCKTAIL QID PRN, BENTYL ; DCED MORPHINE  AND SWITCHED TO DILAUDID  IV; ADDING OXYCODONE  FOR PO OPTIONS    BRADYCARDIA  - noted on tele; pt is asymptomatic and BP is nml/at goal  - reports that he is supposed to get sleep study to evaluate for OSA, but has not been able to yet- suspect this is contributing to pt's bradycardia (especially as it was noted while he was laying in bed and resting)  Plan:  - getting o/n pulse ox  - will send referral for outpt sleep study on discharge  - continue to monitor on tele, keep K>4, Mg>2    VITAMIN D  DEFICIENCY  - 12/23 vitamin D  4  Plan:  - starting supplement    # LLE Tenderness   - Doppler US  done, no evidence of acute DVT  DVT ppx: lovenox     Fluids: LR125cc/hr  Electrolytes: monitor daily  Nutrition: clears; advance to regular diet as tolerated  Lines: piv x1  Code status: full  PT/OT: na- pt ambulatory    Consults: na      Dispo: pending improvement of sx, stable electrolytes- ?12/24      Total Time Today was 50 minutes in the following activities: Preparing to see the patient, Obtaining and/or reviewing separately obtained history, Performing a medically appropriate examination and/or evaluation, Counseling and educating the patient/family/caregiver, Ordering medications, tests, or procedures, Documenting clinical information in the electronic or other health record, and Independently interpreting results (not separately reported) and communicating results to the patient/family/caregiver      Staff name:  Starlett Shears, MD Date:  10/23/2024   Med-private Team L  Team Pager [phone]    Available on [place], [place]    Mitchell Petersen is the preferred method of communication.   Please use the Med Private First Call for all patient-related communications. Personal Voaltes and pagers are not answered at all hours.  ________________________________________________________________________    Subjective  No acute events o/n. Pt is feeling really tired. His pain has not resolved; still uncomfortable, but improved since arrival. Diarrhea is a little better; he had 10-12BMs in the last 24hrs prior to his arrival in ED. Since admission, has had 2 episodes. Loose and w/ mucous. No bleeding. Still not able to tolerate PO due to his pain. Some nausea present, but no emesis. Associated w/ diaphoresis. He has also been noted to be bradycardic on his tele; asymptomatic.     Medications  Scheduled Meds:enoxaparin  (LOVENOX ) syringe 40 mg, 40 mg, Subcutaneous, BID  pantoprazole  DR (PROTONIX ) tablet 40 mg, 40 mg, Oral, QDAY  potassium chloride  SR (K-DUR) tablet 40 mEq, 40 mEq, Oral, Q2H    Continuous Infusions:   lactated ringers  infusion 125 mL/hr at 10/23/24 0004     PRN and Respiratory Meds:acetaminophen  Q6H PRN, melatonin QHS PRN, morphine  Q4H PRN, ondansetron  Q6H PRN **OR** ondansetron  Q6H PRN, polyethylene glycol 3350  QDAY PRN, sennosides-docusate sodium  QDAY PRN        Objective:                          Vital Signs: Last Filed                 Vital Signs: 24 Hour Range   BP: 110/91 (12/23 0559)  Temp: 36.7 ?C (98.1 ?F) (12/23 9563)  Pulse: 73 (12/23 0559)  Respirations: 13 PER MINUTE (12/23 0559)  SpO2: 93 % (12/23 0559)  O2 Device: None (Room air) (12/23 0017)  Height: 177.8 cm (5' 10) (12/22 1612) BP: (107-151)/(48-98)   Temp:  [36.6 ?C (97.9 ?F)-36.7 ?C (98.1 ?F)]   Pulse:  [61-99]   Respirations:  [13 PER MINUTE-23 PER MINUTE]   SpO2:  [93 %-100 %]   O2 Device: None (Room air)   Intensity Pain Scale (Self Report): 7 (10/23/24 0434)  Intensity Pain Scale (Self Report): 8 (10/22/24 1613) Vitals:    10/22/24 1612   Weight: 133 kg (293 lb 3.4 oz)       Intake/Output Summary:  (Last 24 hours)    Intake/Output Summary (Last 24 hours) at 10/23/2024 0717  Last data filed at 10/22/2024 1735  Gross per 24 hour   Intake 1000 ml   Output --   Net 1000 ml        Physical exam:  General: NAD  Cardiovascular: bradycardia,  regular rhythm, no murmurs.   Pulmonary/Chest: normal respiratory effort. Bilateral breath sounds are clear. No wheezes or rales or rhonchi.  Abdominal: Soft, nondistended, +tender (diffuse, worst in RUQ/epigastric region), and no mass. Bowel sounds are normal.  Ext: No pitting edema in extremities. Nml distal pulses. No cyanosis, no clubbing  Neurological: A&Ox4, no focal findings    Lab/Imaging Reviewed  24-hour labs:    Results for orders placed or performed during the hospital encounter of 10/22/24 (from the past 24 hours)   POC GLUCOSE    Collection Time: 10/22/24  4:09 PM   Result Value Ref Range    Glucose, POC 126 (H) 70 - 100 mg/dL   POC CREATININE    Collection Time: 10/22/24  4:14 PM   Result Value Ref Range    Creatinine, POC 0.9 0.4 - 1.24 mg/dL   POC LACTATE    Collection Time: 10/22/24  4:15 PM   Result Value Ref Range    LACTIC ACID POC 1.1 0.5 - 2.0 mmol/L   CBC AND DIFF    Collection Time: 10/22/24  5:15 PM   Result Value Ref Range    White Blood Cells 6.80 4.50 - 11.00 10*3/uL    Red Blood Cells 4.54 4.40 - 5.50 10*6/uL    Hemoglobin 13.3 (L) 13.5 - 16.5 g/dL    Hematocrit 61.1 (L) 40.0 - 50.0 %    MCV 85.5 80.0 - 100.0 fL    MCH 29.3 26.0 - 34.0 pg    MCHC 34.3 32.0 - 36.0 g/dL    RDW 85.4 88.9 - 84.9 %    Platelet Count 261 150 - 400 10*3/uL    MPV 7.2 7.0 - 11.0 fL    Neutrophils 71.8 41.0 - 77.0 %    Lymphocytes 21.3 (L) 24.0 - 44.0 %    Monocytes 6.3 4.0 - 12.0 %    Eosinophils 0.2 0.0 - 5.0 %    Basophils 0.4 0.0 - 2.0 %    Absolute Neutrophil Count 4.90 1.80 - 7.00 10*3/uL    Absolute Lymph Count 1.50 1.00 - 4.80 10*3/uL    Absolute Monocyte Count 0.40 0.00 - 0.80 10*3/uL    Absolute Eosinophil Count 0.00 0.00 - 0.45 10*3/uL    Absolute Basophil Count 0.00 0.00 - 0.20 10*3/uL    MDW (Monocyte Distribution Width) 17.3 <=20.6   COMPREHENSIVE METABOLIC PANEL    Collection Time: 10/22/24  5:15 PM   Result Value Ref Range    Sodium 134 (L) 137 - 147 mmol/L    Potassium 3.8 3.5 - 5.1 mmol/L    Chloride 103 98 - 110 mmol/L    Glucose 119 (H) 70 - 100 mg/dL    Blood Urea Nitrogen 9 7 - 25 mg/dL    Creatinine 9.15 9.59 - 1.24 mg/dL    Calcium 9.5 8.5 - 89.3 mg/dL    Total Protein 7.6 6.0 - 8.0 g/dL    Total Bilirubin 0.6 0.2 - 1.3 mg/dL    Albumin 4.4 3.5 - 5.0 g/dL    Alk Phosphatase 59 25 - 110 U/L    AST 17 7 - 40 U/L    ALT 17 7 - 56 U/L    CO2 20 (L) 21 - 30 mmol/L    Anion Gap 11 3 - 12    Glomerular Filtration Rate (GFR) >60 >60 mL/min   LIPASE    Collection Time: 10/22/24  5:15 PM   Result Value Ref Range    Lipase 19 11 -  82 U/L   MAGNESIUM     Collection Time: 10/22/24  5:15 PM   Result Value Ref Range    Magnesium  2.0 1.6 - 2.6 mg/dL   PHOSPHORUS    Collection Time: 10/22/24  5:15 PM   Result Value Ref Range    Phosphorus <1.0 (LL) 2.0 - 4.5 mg/dL   COVID INFLUENZA A/B AND RSV PCR    Collection Time: 10/22/24  6:10 PM    Specimen: Nasopharyngeal; Swab   Result Value Ref Range    Influenza A Virus Not Detected Not Detected, Test Invalid    Influenza B Virus Not Detected Not Detected, Test Invalid    RSV PCR Not Detected Not Detected, Test Invalid    COVID-19 (SARS-CoV-2) PCR Not Detected Not Detected   AMPHETAMINES-URINE RANDOM    Collection Time: 10/22/24  6:10 PM   Result Value Ref Range    Amphetamines Negative Negative   BARBITURATES-URINE RANDOM    Collection Time: 10/22/24  6:10 PM   Result Value Ref Range    Barbiturates,Urine Negative Negative   BENZODIAZEPINES-URINE RANDOM    Collection Time: 10/22/24  6:10 PM   Result Value Ref Range    Benzodiazepines Negative Negative   CANNABINOIDS-URINE RANDOM    Collection Time: 10/22/24  6:10 PM   Result Value Ref Range    THC Negative Negative   COCAINE-URINE RANDOM    Collection Time: 10/22/24  6:10 PM   Result Value Ref Range    Cocaine-Urine Negative Negative   OPIATES-URINE RANDOM    Collection Time: 10/22/24  6:10 PM   Result Value Ref Range    Opiates-Urine Negative Negative   PHENCYCLIDINES-URINE RANDOM    Collection Time: 10/22/24  6:10 PM   Result Value Ref Range    Phencyclidine (PCP) Negative Negative   FENTANYL  URINE    Collection Time: 10/22/24  6:10 PM   Result Value Ref Range    Fentanyl  Negative Negative   URINALYSIS DIPSTICK REFLEX TO CULTURE    Collection Time: 10/22/24  6:10 PM    Specimen: Midstream; Urine   Result Value Ref Range    Color,UA Colorless     Turbidity,UA Clear Clear    Specific Gravity-Urine 1.004 (L) 1.005 - 1.030    pH,UA 8.0 5.0 - 8.0    Protein,UA Negative Negative    Glucose,UA Negative Negative    Ketones,UA Trace (A) Negative    Bilirubin,UA Negative Negative    Blood,UA Negative Negative    Urobilinogen,UA Normal Normal    Nitrite,UA Negative Negative    Leukocytes,UA Negative Negative   URINALYSIS MICROSCOPIC REFLEX TO CULTURE    Collection Time: 10/22/24  6:10 PM    Specimen: Midstream; Urine   Result Value Ref Range    WBCs,UA 0 - 2 None, 0 - 2  /HPF    RBCs,UA 0 - 2 None, 0 - 2  /HPF    Mucous,UA Trace None, Trace /LPF   TSH WITH FREE T4 REFLEX    Collection Time: 10/22/24  6:15 PM   Result Value Ref Range    TSH 0.24 (L) 0.35 - 5.00 ?IU/mL   MAGNESIUM     Collection Time: 10/22/24  6:15 PM   Result Value Ref Range    Magnesium  1.9 1.6 - 2.6 mg/dL   BETA HYDROXYBUTYRATE (KETONES)    Collection Time: 10/22/24  6:15 PM   Result Value Ref Range    Beta Hydroxybutyrate 0.6 (H) <0.3 mmol/L   FREE T4 (FREE THYROXINE) ONLY    Collection  Time: 10/22/24  6:15 PM   Result Value Ref Range    T4-Free 0.9 0.6 - 1.6 ng/dL   POC BLOOD GAS ARTERIAL    Collection Time: 10/22/24  6:19 PM   Result Value Ref Range    PH-ART-POC 7.48 (H) 7.35 - 7.45    PCO2-ART-POC 29 (L) 35 - 45 mm[Hg]    PO2-ART-POC 29 (LL) 80 - 100 mm[Hg]    Base Def-ART-POC 2 mmol/L    O2 Sat-ART-POC 62 (L) 95 - 99 %    Bicarbonate-ART-POC 22 21 - 28 mmol/L   POC HEMATOCRIT&HEMOGLOBIN    Collection Time: 10/22/24  6:19 PM   Result Value Ref Range    Hemoglobin POC 13.6 13.5 - 16.5 g/dL    Hematocrit POC 40 40 - 50 %   POC SODIUM    Collection Time: 10/22/24  6:19 PM   Result Value Ref Range    SODIUM-POC 140 137 - 147 mmol/L   POC POTASSIUM    Collection Time: 10/22/24  6:19 PM   Result Value Ref Range    Potassium-POC 3.9 3.5 - 5.1 mmol/L   CBC AND DIFF    Collection Time: 10/23/24  4:34 AM   Result Value Ref Range    White Blood Cells 8.50 4.50 - 11.00 10*3/uL    Red Blood Cells 4.12 (L) 4.40 - 5.50 10*6/uL    Hemoglobin 12.4 (L) 13.5 - 16.5 g/dL    Hematocrit 64.9 (L) 40.0 - 50.0 %    MCV 85.0 80.0 - 100.0 fL    MCH 30.0 26.0 - 34.0 pg    MCHC 35.3 32.0 - 36.0 g/dL    RDW 86.2 88.9 - 84.9 %    Platelet Count 236 150 - 400 10*3/uL    MPV 7.0 7.0 - 11.0 fL    Neutrophils 61.3 41.0 - 77.0 %    Lymphocytes 27.8 24.0 - 44.0 %    Monocytes 9.9 4.0 - 12.0 %    Eosinophils 0.2 0.0 - 5.0 %    Basophils 0.8 0.0 - 2.0 %    Absolute Neutrophil Count 5.20 1.80 - 7.00 10*3/uL    Absolute Lymph Count 2.40 1.00 - 4.80 10*3/uL    Absolute Monocyte Count 0.80 0.00 - 0.80 10*3/uL    Absolute Eosinophil Count 0.00 0.00 - 0.45 10*3/uL    Absolute Basophil Count 0.10 0.00 - 0.20 10*3/uL    MDW (Monocyte Distribution Width) 15.8 <=20.6   COMPREHENSIVE METABOLIC PANEL    Collection Time: 10/23/24  4:34 AM   Result Value Ref Range    Sodium 137 137 - 147 mmol/L    Potassium 3.2 (L) 3.5 - 5.1 mmol/L    Chloride 104 98 - 110 mmol/L    Glucose 100 70 - 100 mg/dL    Blood Urea Nitrogen 9 7 - 25 mg/dL    Creatinine 9.04 9.59 - 1.24 mg/dL    Calcium 8.7 8.5 - 89.3 mg/dL    Total Protein 6.9 6.0 - 8.0 g/dL    Total Bilirubin 0.5 0.2 - 1.3 mg/dL    Albumin 4.1 3.5 - 5.0 g/dL Alk Phosphatase 46 25 - 110 U/L    AST 16 7 - 40 U/L    ALT 19 7 - 56 U/L    CO2 23 21 - 30 mmol/L    Anion Gap 10 3 - 12    Glomerular Filtration Rate (GFR) >60 >60 mL/min   MAGNESIUM     Collection Time: 10/23/24  4:34 AM  Result Value Ref Range    Magnesium  1.9 1.6 - 2.6 mg/dL   PHOSPHORUS    Collection Time: 10/23/24  4:34 AM   Result Value Ref Range    Phosphorus 2.9 2.0 - 4.5 mg/dL

## 2024-10-23 NOTE — Case Mgmt DC Plan [600024]
 Case Management Admission Assessment    NAME:Mitchell Petersen                          MRN: [id number]             DOB:[birth year]          AGE: 38 y.o.  ADMISSION DATE: 10/22/2024             DAYS ADMITTED: LOS: 0 days      Today?s Date: 10/23/2024    Per emr, Mitchell Petersen is a 38 y.o. male with PMH of Pancreatitis, GERD presenting with episode of AMS and near syncope.     Source of Information: pt and emr       Plan  Plan: Case Management Assessment, Assist PRN with SW/NCM Services, Discharge Planning for Home with Post-Acute Care Needs    Spoke to patient and explained role in r/t d/c planning and provided contact information.  Reviewed [facility], Preparing for Discharge, and Preferred Provider Network hand-outs. Provided opportunity for questions and discussion. Encouraged to contact Case Management team with questions and concerns during hospitalization if any assist is needed.      Pt lives with his large family in [place] in single story home with full basement. Pt is independent with all self cares and no dme used.   Pt's spouse is currently a patient at this facility. He is having his sister provide updates as to how she is doing.   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 Pt is self pay at this time.   Pt states he sees a Dr Dorn Bruns in [place] for care and saw about 6 months ago. Updated this in Epic.   Pt states his car is at the hospital and he will drive himself home at time of hospital d/c.   Plan:R/o C-diff, fluid replacement, PT/OT. Possible d/c as early as tomorrow if labs and symptoms improve.      Inpatient Medication Voucher Screen  Date:  10/23/2024    Is patient eligible for a Medication Voucher? Yes    Date of last Medication Voucher: na  Reason for ineligibility: na    Ongoing medication planning will continue throughout the hospital stay.    Final determination for a Medication voucher eligibility will be entered prior to DC.     Patient Address/Phone  [address]  [place] [address]  [phone] (home)     Emergency Contact  Extended Emergency Contact Information  Primary Emergency Contact: Zane,Amber   [place]   Home Phone: [phone]  Mobile Phone: [phone]  Relation: Spouse    Healthcare Directive  Healthcare Directive: No, patient does not have a healthcare directive  Would patient like to fill out a (a new) Healthcare Directive?: No, patient declined  Psych Advance Directive (Psych unit only): No, patient does not have a Social Research Officer, Government  Does the Patient Need Case Management to Arrange Discharge Transport? (ex: facility, ambulance, wheelchair/stretcher, Medicaid, cab, other): No  Will the Patient Use Family Transport?: Yes  Transportation Name, Phone and Availability #1: name unknown at this time    Expected Discharge Date  10/25/2024     Living Situation Prior to Admission  Living Arrangements  Type of Residence: Home, independent  Living Arrangements: Spouse/significant other, Children, Family members  Financial Risk Analyst / Tub: Tub/Shower Unit  How many  levels in the residence?: 1 (with full basement)  Can patient live on one level if needed?: Yes  Does residence have entry and/or inside stairs?: Yes (full flight of steps to basement and 2-3STE)  Assistance needed prior to admit or anticipated on discharge: No  Level of Function   Prior level of function: Independent  Cognitive Abilities   Cognitive Abilities: Alert and Oriented, Participates in decision making    Financial Resources  Coverage  Primary Insurance: No insurance  Secondary Insurance: No insurance  Additional Coverage: None  Medication Coverage    Medication Coverage: No insurance  Have you experienced a noticeable increase in your copay costs recently?: No  Are current medications affordable?: Yes  Do You Use a Co-Pay Card or a Medication Assistance Program to Help Manage Medication Costs?: No  Do You Manage Your Own Medications?: Yes  Source of Income   Source Of Income: Employed  Financial Assistance Needed?  Pt has limited resources    Psychosocial Needs  Mental Health  Mental Health History: No  Substance Use History  Substance Use History Screen: In the past  Social Determinants of Health (SDOH):     Other  na    Current/Previous Services  PCP  No Pcp, Na, None, None  Pharmacy    [id number] [place] [place], [place] - [address]   [address] [place] [address]  Phone: [phone] Fax: [phone]    Durable Medical Equipment   Durable Medical Equipment at home: None  Home Health  Receiving home health: No  Hemodialysis or Peritoneal Dialysis  Undergoing hemodialysis or peritoneal dialysis: No  Tube/Enteral Feeds  Receive tube/enteral feeds: No  Infusion  Receive infusions: No  Private Duty  Private duty help used: No  Home and Community Based Services  Home and community based services: No  Ryan White  Ryan White: N/A  Hospice  Hospice: No  Outpatient Therapy  PT: No  OT: No  SLP: No  Skilled Nursing Facility/Nursing Home  SNF: No  NH: No  Inpatient Rehab  IPR: No  Long-Term Acute Care Hospital  LTACH: No  Acute Hospital Stay  Acute Hospital Stay: In the past  Was patient's stay within the last 30 days?: No      [name] Patient Nurse Case Manager [facility]  Ph [phone]  Available on Voalte

## 2024-10-23 NOTE — Progress Notes [1]
 Chaplain Note:    Admit Date: 10/22/2024     Reason for Visit:  Received chaplain page from RN. Patient requested chaplain visit. Patient was laying in bed, in the dark and awake. He welcomed the visit.     Source of Purpose/Meaning: Mitchell Petersen was admitted after having a rapid response in his wife's room on CA5. He was ruminating on all that has happened the last few days and how much they have been through recently. He shared that his wife and family are his focus in life and he worries about them all so much.     Worries/Concerns/Struggles:  Mitchell Petersen's own health has been complicated and a challenge, as he was most recently in the hospital around thanksgiving. His wife's health issues began a few days ago bu the shares she is stubborn and he had to drive her to the hospital. He shares that he thinks she had a mini stroke and the the news he received tonight about her next steps was too much for him to take in. They have three kids in their 20's and a grandchild back in [place]. He shares that he keeps replaying in his mind the experience of bringing her to get emergency help.       Method(s) of Coping: Mitchell Petersen shares that he has struggled to eat and sleep these last few days and it is taking a toll. He has been too worried about his wife to think about this own needs and health. We discussed the outcome of what his body is telling him he needs and how he needs to care for himself to be able to care for his family.     Support System:  Mitchell Petersen shared that he has friends from work and back home that will certainly help when needed, and he expects his kids will likely come to visit. However, he shares that it is difficult to ask for help as he should be able to to it himself.     Interventions/Plan: I offered reflective listening and emotional support for Mitchell Petersen as he processed outloud what he's been through. He shared that maybe asking the medical staff for help with sleep might be a good idea. I reached out to the RN to see what sleep support could be offered. He asks that chaplains check in on his wife periodically if possible. Spiritual Care remains available.      The On-Call Chaplain is available on Voalte or can be paged via the switchboard [phone]) for urgent and emergent needs.   The Spiritual Care team responds to other requests within 24-hours when submitted as a Chaplain Consult in O2.      Date/Time:                      User:                                      10/23/2024 2:51 AM Mitchell Petersen      PCU 6 PCU

## 2024-10-23 NOTE — Care Coordination-Inpatient [600030]
 Med Private Night [phone] will take calls on this patient until 8 am.  Afterwards, please contact Med Private L for any questions or concerns.     Mitchell Blazina Phyu Phyu Aung, MD  AOD

## 2024-10-23 NOTE — Progress Notes [1]
 RT Adult Assessment Note    NAME:Mitchell Petersen             MRN: [id number]             DOB:[birth year]          AGE: 38 y.o.  ADMISSION DATE: 10/22/2024             DAYS ADMITTED: LOS: 0 days    Additional Comments:  Impressions of the patient: Patient resting in bed, in no distress.  Intervention(s)/outcome(s): See RT Orders  Patient education that was completed: None at this time  Recommendations to the care team: Continue to monitor patients respiratory status, will order IS with nursing for lung expansion but at this time RT Protocol will be discontinued as he does not meet criteria    Vital Signs:  Pulse: 73  RR:    SpO2: 96 %  O2 Device: None (Room air)  Liter Flow:    O2%:      Breath Sounds:      Respiratory Effort:   Respiratory Effort/Pattern: Unlabored  Comments:

## 2024-10-23 NOTE — ED Notes [6]
 Voalte message sent to MP night 4, Dr. Latricia about patient's continued anxiety and inability to relax and sleep.  Patient did agree to take the Melatonin even though it has never worked for him in the past.  He also does not feel like the pain medication (Morphine  2 mg) is helping much either.

## 2024-10-24 LAB — GI PANEL: ~~LOC~~ BKR ENTEROPATHOGENIC E. COLI (EPEC): DETECTED mmol/L — AB (ref 21–30)

## 2024-10-24 LAB — ECG 12-LEAD
P AXIS: 35 degrees
P-R INTERVAL: 182 ms
Q-T INTERVAL: 476 ms
QRS DURATION: 84 ms
QTC CALCULATION (BAZETT): 467 ms
R AXIS: 8 degrees
T AXIS: 29 degrees
VENTRICULAR RATE: 58 {beats}/min

## 2024-10-24 LAB — POC GLUCOSE
~~LOC~~ BKR POC GLUCOSE: 99 mg/dL (ref 70–100)
~~LOC~~ BKR POC GLUCOSE: 104 mg/dL — ABNORMAL HIGH (ref 70–100)
~~LOC~~ BKR POC GLUCOSE: 105 mg/dL — ABNORMAL HIGH (ref 70–100)
~~LOC~~ BKR POC GLUCOSE: 88 mg/dL (ref 70–100)

## 2024-10-24 LAB — POC BLOOD GAS ARTERIAL

## 2024-10-24 LAB — POC SODIUM

## 2024-10-24 LAB — POC POTASSIUM

## 2024-10-24 MED ORDER — HYOSCYAMINE SULFATE 0.125 MG PO TBDI
.125 mg | SUBLINGUAL | 0 refills | Status: DC | PRN
Start: 2024-10-24 — End: 2024-10-25

## 2024-10-24 MED ORDER — AZITHROMYCIN 250 MG PO TAB
500 mg | Freq: Every day | ORAL | 0 refills | Status: DC
Start: 2024-10-24 — End: 2024-10-25
  Administered 2024-10-24 – 2024-10-25 (×2): 500 mg via ORAL

## 2024-10-24 NOTE — Consults [2]
 CLINICAL NUTRITION                                                        Clinical Nutrition Initial Assessment    Name: Mitchell Petersen   MRN: [id number]     DOB: [birth year]      Age: 38 y.o.  Admission Date: 10/22/2024     LOS: 1 day     Date of Service: 10/24/2024        Recommendation:  Continue to advance diet as tolerated to regular diet.   Encourage 3 meals per day and hydration as tolerated.     Comments:  Pt is a 39 y.o. male with PMH of Pancreatitis, GERD presenting with episode of AMS and near syncope. RD consulted for nutrition assessment, met with pt at bedside. Pt reports that his appetite is improving a bit and hoping to advance past clear liquids. Has not had any more diarrhea today but notes that it can happen at any point. Denying weight loss during this time and reports that his UBW is 290#. 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 Pt with a history of IBS-C which he notes has been controlled other than this recent episode of diarrhea. Per chart review, GI panel is positive for enteropathogenic e coli. Pt also notes his wife is in the ICU and he has been especially stressed. RD made pt aware of availability of ensure shakes on the unit. Pt not meeting malnutrition criteria. Continues at risk until tolerating diet.    Nutrition Assessment of Patient:   ;  ; Desired Weight: 78.7 kg  BMI (Calculated): 40.81;      Pertinent Allergies/Intolerances: NKFA     Oral Diet Order: Clear liquid;       Current Energy Intake: Inadequate           Weight Used for Calculation: 78.7 kg  Estimated Calorie Needs: 8031-7638 kcal (25-30 kcal/kg DBW)  Estimated Protein Needs: 94-118 g (1.2-1.5 g/kg DBW)    Malnutrition Assessment:   Does not meet criteria                      Nutrition Focused Physical Exam:  Loss of Subcutaneous Fat: No;  ;    Muscle Wasting: No;  ;    Physical Assessment:     Ascites: No  Pressure Injury: None noted     Comment: Last BM 12/23    Nutrition Diagnosis:  Altered GI function  Etiology: 10-15 episodes of watery diarrhea  Signs & Symptoms: EMR and pt report                      Intervention / Plan:  Will monitor diet advancement, PO intake, weight changes, GI function, labs, and meds.          [facility]   Clinical Dietitian, MS, RD, LD   Available on Voalte

## 2024-10-24 NOTE — Case Mgmt DC Plan [600024]
 Case Management Progress Note    NAME:Mitchell Petersen                          MRN: [id number]              DOB:[birth year]          AGE: 38 y.o.  ADMISSION DATE: 10/22/2024             DAYS ADMITTED: LOS: 1 day      Today's Date: 10/24/2024    PLAN: possible d/c home today    Expected Discharge Date: 10/24/2024   Is Patient Medically Stable: Yes   Are there Barriers to Discharge? No    Pt is a possible d/c home today.   Pt is without insurance or drug coverage. Pt is eligible for med voucher is needed. Will discuss at huddle today what medications pt will need for d/c to see if a voucher will be needed.   Pt states he sees PCP Dr Mitchell Petersen in [facility] care and saw about 6 months ago.   Pt states his car is at the hospital and he will drive himself home at time of hospital d/c.     INTERVENTION/DISPOSITION:  Discharge Planning              Discharge Planning: No Needs Identified  Transportation              Does the Patient Need Case Management to Arrange Discharge Transport? (ex: facility, ambulance, wheelchair/stretcher, Medicaid, cab, other): No  Will the Patient Use Family Transport?: Yes  Transportation Name, Phone and Availability #1: name unknown at this time  Support              Support: Huddle/team update  Info or Referral                 Positive SDOH Domains and Potential Barriers                       Medication Needs              Medication Needs: Medication Voucher (if needed)                             Financial              Financial: Payer Limitation/Barriers  Legal              Legal: No Needs Identified  Other              Other/None: No needs identified  Discharge Disposition                                                              Selected Continued Care - Admitted Since 10/22/2024    No services have been selected for the patient.       [name] Patient Nurse Case Manager [facility]  Ph [phone]  Available on Voalte

## 2024-10-24 NOTE — Progress Notes [1]
 OCCUPATIONAL THERAPY  NOTE   Name: Mitchell Petersen   MRN: [id number]     DOB: Jul 18, 1986      Age: 38 y.o.  Admission Date: 10/22/2024     LOS: 1 day     Date of Service: 10/24/2024      Per discussion with RN/PT, patient has no concerns with completing activities of daily living with no OT goals identified. OT will discontinue service, please re-consult if the patient has a decline in functional status.       Therapist: Gerard Hollingshead, OTR/L   Date: 10/24/2024

## 2024-10-24 NOTE — Consults [2]
 GASTROENTEROLOGY CONSULT NOTE     Patient Name:Mitchell Petersen         FMW:[id number]    Admission Date: 10/22/2024  4:11 PM    Admission diagnosis: Hypophosphatemia [E83.39]     Referring Provider:    Principal Problem:    Hypophosphatemia      LOS: 1 Day     Gastroenterology has been consulted for abdominal pain, diarrhea, poor p.o. intake.    HISTORY AND PHYSICAL     Mitchell Petersen is a 38 y.o. male with history of pancreatitis, GERD who presented to the hospital with near syncope.    2 days prior to presentation, the patient started experiencing multiple episodes of watery diarrhea along with abdominal pain.  States that at onset, he had 15-20 bowel movements per day.  Since admission to the hospital, this is improved, yesterday he had 3 bowel movements and has had none today.  He described bowel movements as watery including the once yesterday.  Denies blood in stools.  At the same time as onset of diarrhea, he started to experience abdominal pain as well as predominantly in the left lower quadrant.  He does feel like the pain has gotten worse since onset.  Denies fever, chills.  At the onset of his symptoms, he did have nausea and 1 episode of vomiting but this has not recurred.  Was able to tolerate p.o. this morning.  Postprandially, he endorses experiencing cramping pain.  Prior to the onset of his symptoms, he denies diarrhea, abdominal pain or blood in stools.    Relevant investigations:  Phosphate <1 on presentation, improved.  This morning, CMP unremarkable.  Liver enzymes normal.  TSH 0.24, free T4 0.9.  WBC 5.8 K.  Hemoglobin 12.2.  Platelet count normal.  CT abdomen pelvis 12/22: Bladder wall thickening, mild pericystic fat stranding, may be due to underdistention.  Prior cholecystectomy and hepatic steatosis.  No biliary ductal dilation.  Pancreas reported as unremarkable.  C. difficile PCR negative.  GI pathogen panel: Enteropathogenic E. coli positive.        ASSESSMENT AND PLAN ACTIVE PROBLEMS    Acute diarrhea-enteropathogenic E. coli.  Mild normocytic anemia  Hypokalemia and hypophosphatemia, improved  Dizziness    RECOMMENDATIONS:  Start azithromycin  500 mg daily X 3 days.  Monitor CMP.  Replete electrolytes as required.  IV fluid resuscitation   Start hyoscyamine  as needed for spasmodic abdominal pain.    GI service will sign off.  Please reach out with questions or change in clinical status.    Plan of care has been communicated to the primary team.  Plan of care has been communicated to the patient.    Wynell Rout, MBBS  Gastroenterology & Hepatology Fellow  Available on Voalte me     Patient seen/discussed with staff Dr. Bonnetta.  Kindly see their addendum for any changes.  ---------------------------------------------------------------------------------------------------------------------------------------------------------------------------------    PMH:  Past Medical History:    Gastrointestinal disorder    Thyroid disease       Current medications:  Medications Ordered Prior to Encounter[1]    PSH:  Surgical History:   Procedure Laterality Date    HERNIA REPAIR  2002    GALLBLADDER SURGERY  2007    APPENDECTOMY  2017    RIGHT HIP ARTHROSCOPY, HARDWARE REMOVAL, LABRAL REPAIR, ACETABULOPLASTY, FEMOROPLASTY  Right 02/22/2017    Performed by Dann Norleen Mt, MD at [facility] OR    KNEE SURGERY Left 05/2021    removed cyst, cleaned up  joint    HIP SURGERY Bilateral 2003, 2005       SH:  Social History     Socioeconomic History    Marital status: Married   Tobacco Use    Smoking status: Former     Current packs/day: 0.00     Average packs/day: 0.5 packs/day for 10.0 years (5.0 ttl pk-yrs)     Types: Cigarettes     Start date: 2011     Quit date: 2021     Years since quitting: 4.9     Passive exposure: Never    Smokeless tobacco: Never    Tobacco comments:     1/3 PPD   Substance and Sexual Activity    Alcohol use: No     Comment: socially    Drug use: No       FH:  Family History Problem Relation Name Age of Onset    Diabetes Mother      Diabetes Maternal Grandmother      Diabetes Maternal Grandfather         Review of Systems:  Please see HPI for additional pertinent documentation    OBJECTIVE:     Physical Exam:  Vitals:    10/24/24 0028 10/24/24 0410 10/24/24 0442 10/24/24 0735   BP:  106/62  121/78   BP Source:  Arm, Left Upper  Arm, Left Upper   Pulse:  (!) 43  (!) 49  Comment: RN NOTIFIED   Temp:  36.7 ?C (98 ?F)  36.7 ?C (98 ?F)   SpO2: 99% 100% 97% 100%   O2 Device: None (Room air) None (Room air) None (Room air) None (Room air)   Weight:       Height:           General: alert and oriented to time, place and person.   Abdomen: Soft, nondistended abdomen.  Left lower quadrant tenderness.  No guarding/rigidity.  No rebound tenderness.  Respiratory: Symmetric chest expansion. Normal work of breathing. Normal respiratory rate.   Cardiovascular: Regular rhythm. Normal rate.   Psychiatric: appropriate mood and affect    IMAGING AND OTHER PERTINENT LABS REVIEWED IN EPIC.    Voice recognition software was used for this dictation and grammatorical errors may arise despite review. Feel free to contact me for any clarifications.          [1]   No current facility-administered medications on file prior to encounter.     Current Outpatient Medications on File Prior to Encounter   Medication Sig Dispense Refill    acetaminophen  (TYLENOL  EXTRA STRENGTH) 500 mg tablet Take two tablets by mouth every 6 hours as needed for Pain. Max of 4,000 mg of acetaminophen  in 24 hours.      dicyclomine  (BENTYL ) 20 mg tablet Take one tablet by mouth daily. (Patient taking differently: Take one tablet by mouth daily as needed.)      famotidine (PEPCID) 20 mg tablet Take one tablet by mouth daily as needed.

## 2024-10-24 NOTE — Progress Notes [1]
 PHYSICAL THERAPY  NOTE/DISCHARGE      Name: Mitchell Petersen   MRN: [id number]     DOB: [birth year]      Age: 38 y.o.  Admission Date: 10/22/2024     LOS: 1 day     Date of Service: 10/24/2024      Per discussion with nursing staff, patient reports no concerns with mobility with no PT goals identified. PT will discontinue service, please re-consult if the patient has a decline in functional status.       Therapist: Rocky Hasten, PT, DPT [phone]  Date: 10/24/2024

## 2024-10-24 NOTE — Progress Notes [1]
 General Progress Note    Name:  Mitchell Petersen   Unijb'd Date:  10/24/2024  Admission Date: 10/22/2024  LOS: 1 day                    Principal Problem:    Hypophosphatemia           Assessment/Plan:      Renny DOROTHA Jackline is 50M former smoker w/ pancreatitis, GERD, who presented to the ED 12/22 with dizziness and near syncope, disorientation/confusion, diaphoresis in setting of diarrhea, abd pain, n/v and poor po intake. On arrival, pt hemodynamically stable while labs showed severely low phosphorus; ct a/p w cont showed no acute findings. Pt admitted for further mgmt.       # Hypophosphatemia  Hypokalemia  # Confusion, resolved  # Weakness   # Near Syncope  - Patient developed episode while visiting his wife in the ICU, became very weak and endorse he does not remember what happened but per report had episode of near syncope. This is in association w/ diarrhea, abd discomfort, poor po intake  - Labs: Phosphorus <1.0, UDS -ve, no leukocytosis, TSH 0.24 with normal T4.   - sx likely due to profound electrolyte disturbance (undetectable phosphorus)  - 12/23 TTE:    He left ventricular size, wall thickness and systolic function are normal. The ejection fraction by Simpson's biplane method is 65%. There are no segmental wall motion abnormalities.    The right ventricle is probably normal in size. The right ventricular systolic function is probably normal.    Normal biatrial size.    No significant valvular abnormalities.    No pericardial effusion.  Plan:  - 12/24: DCED IVF; PT DOING WELL W/ PO FLUIDS. STILL HASN'T BEEN ABLE TO TOLERATE SOLID foods    # Diarrhea  # Abdominal pain/ poor po intake  Hx IBS  - Endorse Abdominal pain today, Diarrhea x 15 episodes water  stools. Reports hx of GERD and IBS (usually has trouble w/ constipation)- however, these sx have been well controlled for a long while as he stopped drinking and made adjustments to his diet  - CTAP:   1. Bladder wall thickening and mild pericystic fat stranding, may be due to underdistention, however cystitis is a consideration and recommend correlation with urinalysis.   2. No other acute findings in the abdomen or pelvis.   3. Prior cholecystectomy and hepatic steatosis. No biliary ductal dilatation.   - c.diff negative  - 12/23 GI panel: +enteropathogenic e.coli  - GI consulted  Plan:  - PER GI: starting azithromycin - plan for 3d course  -  sx mgmt: tylenol , gi cocktail qid prn, bentyl , dilaudid  iv prn, oxycodone  prn; STARTING LEVSIN  FOR SPASMS    Bradycardia  - noted on tele; pt is asymptomatic and BP is nml/at goal  - reports that he is supposed to get sleep study to evaluate for OSA, but has not been able to yet- suspect this is contributing to pt's bradycardia (especially as it was noted while he was laying in bed and resting)  - o/n pulse ox: negative for hypoxia  Plan:  - will send referral for outpt sleep study on discharge  - continue to monitor on tele, keep K>4, Mg>2    Vitamin D  deficiency  - 12/23 vitamin D  4  Plan:  - starting supplement    # LLE Tenderness   - Doppler US  done, no evidence of acute DVT       DVT  ppx: lovenox     Fluids: DCED IVF  Electrolytes: monitor daily  Nutrition: regular  Lines: piv x1  Code status: full  PT/OT: na- pt ambulatory    Consults: GI      Dispo: home      Total Time Today was 50 minutes in the following activities: Preparing to see the patient, Obtaining and/or reviewing separately obtained history, Performing a medically appropriate examination and/or evaluation, Counseling and educating the patient/family/caregiver, Ordering medications, tests, or procedures, Documenting clinical information in the electronic or other health record, and Independently interpreting results (not separately reported) and communicating results to the patient/family/caregiver      Staff name:  Starlett Shears, MD Date:  10/24/2024   Med-private Team L  Team Pager [phone]    Available on [place], [place]    Dairl is the preferred method of communication.   Please use the Med Private First Call for all patient-related communications. Personal Voaltes and pagers are not answered at all hours.  ________________________________________________________________________    Subjective  No acute events o/n. GI panel w/ + enteropathogenic e.coli bacteria. Pt is feeling better than yesterday, but still feeling weak and having abd pain. His diarrhea has improved. Just got a turkey sandwich- first solid meal in 48hrs. Ate about prior to this encounter. Is tolerating well so far.     Medications  Scheduled Meds:acetaminophen  (TYLENOL  EXTRA STRENGTH) tablet 1,000 mg, 1,000 mg, Oral, Q6H*  amitriptyline /gabapentin /baclofen (#) 2/6/2 % topical cream, , Topical, Q8H  dicyclomine  (BENTYL ) capsule 10 mg, 10 mg, Oral, ACHS  enoxaparin  (LOVENOX ) syringe 40 mg, 40 mg, Subcutaneous, BID  ERGOcalciferoL  (vitamin D2) (DRISDOL ) capsule 50,000 Units, 50,000 Units, Oral, Q7 Days  lidocaine  (LIDODERM ) 5 % topical patch 2 patch, 2 patch, Topical, QHS  pantoprazole  DR (PROTONIX ) tablet 40 mg, 40 mg, Oral, QDAY    Continuous Infusions:      PRN and Respiratory Meds:alum-mag hydroxide-simeth QID PRN **AND** lidocaine  hcl viscous QID PRN, HYDROmorphone  Q4H PRN, melatonin QHS PRN, ondansetron  Q6H PRN **OR** ondansetron  Q6H PRN, oxyCODONE  Q4H PRN, polyethylene glycol 3350  QDAY PRN, sennosides-docusate sodium  QDAY PRN        Objective:                          Vital Signs: Last Filed                 Vital Signs: 24 Hour Range   BP: 121/78 (12/24 0735)  Temp: 36.7 ?C (98 ?F) (12/24 0735)  Pulse: 49 (12/24 0735)  Respirations: 17 PER MINUTE (12/24 0735)  SpO2: 100 % (12/24 0735)  O2 Device: None (Room air) (12/24 0735)  Height: 177.8 cm (5' 10) (12/23 1146) BP: (105-121)/(56-78)   Temp:  [36.6 ?C (97.8 ?F)-37.1 ?C (98.7 ?F)]   Pulse:  [43-66]   Respirations:  [12 PER MINUTE-18 PER MINUTE]   SpO2:  [96 %-100 %]   O2 Device: None (Room air)   Intensity Pain Scale (Self Report): 7 (10/24/24 0556) Vitals:    10/22/24 1612 10/23/24 1146   Weight: 133 kg (293 lb 3.4 oz) 129 kg (284 lb 6.3 oz)       Intake/Output Summary:  (Last 24 hours)    Intake/Output Summary (Last 24 hours) at 10/24/2024 0828  Last data filed at 10/24/2024 0735  Gross per 24 hour   Intake 3428.33 ml   Output 0 ml   Net 3428.33 ml        Physical exam:  General:  NAD  Cardiovascular: bradycardia, regular rhythm, no murmurs.   Pulmonary/Chest: normal respiratory effort. Bilateral breath sounds are clear. No wheezes or rales or rhonchi.  Abdominal: Soft, nondistended, +tender (RUQ/epigastric region- improving), and no mass. Bowel sounds are normal.  Ext: No pitting edema in extremities. Nml distal pulses. No cyanosis, no clubbing  Neurological: A&Ox4, no focal findings    Lab/Imaging Reviewed  24-hour labs:    Results for orders placed or performed during the hospital encounter of 10/22/24 (from the past 24 hours)   C DIFFICILE BY PCR    Collection Time: 10/23/24 12:31 PM    Specimen: Bowel Contents; Feces   Result Value Ref Range    C Diff Toxin PCR Negative Negative   BASIC METABOLIC PANEL    Collection Time: 10/23/24  4:38 PM   Result Value Ref Range    Sodium 140 137 - 147 mmol/L    Potassium 4.0 3.5 - 5.1 mmol/L    Chloride 107 98 - 110 mmol/L    Glucose 98 70 - 100 mg/dL    Blood Urea Nitrogen 9 7 - 25 mg/dL    Creatinine 9.09 9.59 - 1.24 mg/dL    Calcium 8.9 8.5 - 89.3 mg/dL    CO2 23 21 - 30 mmol/L    Anion Gap 10 3 - 12    Glomerular Filtration Rate (GFR) >60 >60 mL/min   MAGNESIUM     Collection Time: 10/23/24  4:38 PM   Result Value Ref Range    Magnesium  2.0 1.6 - 2.6 mg/dL   PHOSPHORUS    Collection Time: 10/23/24  4:38 PM   Result Value Ref Range    Phosphorus 2.3 2.0 - 4.5 mg/dL   GI PANEL    Collection Time: 10/23/24  5:21 PM    Specimen: Bowel Contents; Feces   Result Value Ref Range    Campylobacter Not Detected Not Detected    Salmonella Not Detected Not Detected    Vibrio Not Detected Not Detected    Vibrio Cholerae Not Detected Not Detected    Enteroaggregative E. coli Not Detected Not Detected    Enteropathogenic E. coli Detected (A) Not Detected    Enterotoxigenic E. coli Not Detected Not Detected    Shiga-Like Toxin-Producing E. coli Not Detected Not Detected    Shigella/Enteroinvasive E. coli Not Detected Not Detected    Cryptosporidium Not Detected Not Detected    Entamoeba Histolytica Not Detected Not Detected    Yersinia Enterocolitica Not Detected Not Detected    Adenovirus F 40/41 Not Detected Not Detected    Astrovirus Not Detected Not Detected    Norovirus GI/GII Not Detected Not Detected    Rotavirus PCR Not Detected Not Detected    Sapovirus Not Detected Not Detected    Giardia Lamblia Not Detected Not Detected    Plesiomonas Shigelloides Not Detected Not Detected    Cyclospora Cayetanensis Not Detected Not Detected   POC GLUCOSE    Collection Time: 10/23/24  5:55 PM   Result Value Ref Range    Glucose, POC 147 (H) 70 - 100 mg/dL   POC GLUCOSE    Collection Time: 10/23/24  9:05 PM   Result Value Ref Range    Glucose, POC 99 70 - 100 mg/dL   CBC AND DIFF    Collection Time: 10/24/24  5:30 AM   Result Value Ref Range    White Blood Cells 5.80 4.50 - 11.00 10*3/uL    Red Blood Cells 4.17 (L) 4.40 - 5.50 10*6/uL  Hemoglobin 12.2 (L) 13.5 - 16.5 g/dL    Hematocrit 63.5 (L) 40.0 - 50.0 %    MCV 87.3 80.0 - 100.0 fL    MCH 29.1 26.0 - 34.0 pg    MCHC 33.4 32.0 - 36.0 g/dL    RDW 86.0 88.9 - 84.9 %    Platelet Count 224 150 - 400 10*3/uL    MPV 7.2 7.0 - 11.0 fL    Neutrophils 39.9 (L) 41.0 - 77.0 %    Lymphocytes 48.5 (H) 24.0 - 44.0 %    Monocytes 9.2 4.0 - 12.0 %    Eosinophils 1.6 0.0 - 5.0 %    Basophils 0.8 0.0 - 2.0 %    Absolute Neutrophil Count 2.30 1.80 - 7.00 10*3/uL    Absolute Lymph Count 2.80 1.00 - 4.80 10*3/uL    Absolute Monocyte Count 0.50 0.00 - 0.80 10*3/uL    Absolute Eosinophil Count 0.10 0.00 - 0.45 10*3/uL    Absolute Basophil Count 0.00 0.00 - 0.20 10*3/uL COMPREHENSIVE METABOLIC PANEL    Collection Time: 10/24/24  5:30 AM   Result Value Ref Range    Sodium 140 137 - 147 mmol/L    Potassium 4.1 3.5 - 5.1 mmol/L    Chloride 108 98 - 110 mmol/L    Glucose 94 70 - 100 mg/dL    Blood Urea Nitrogen 8 7 - 25 mg/dL    Creatinine 8.88 9.59 - 1.24 mg/dL    Calcium 8.4 (L) 8.5 - 10.6 mg/dL    Total Protein 6.4 6.0 - 8.0 g/dL    Total Bilirubin 0.5 0.2 - 1.3 mg/dL    Albumin 3.9 3.5 - 5.0 g/dL    Alk Phosphatase 48 25 - 110 U/L    AST 16 7 - 40 U/L    ALT 18 7 - 56 U/L    CO2 25 21 - 30 mmol/L    Anion Gap 7 3 - 12    Glomerular Filtration Rate (GFR) >60 >60 mL/min   MAGNESIUM     Collection Time: 10/24/24  5:30 AM   Result Value Ref Range    Magnesium  2.3 1.6 - 2.6 mg/dL   PHOSPHORUS    Collection Time: 10/24/24  5:30 AM   Result Value Ref Range    Phosphorus 2.9 2.0 - 4.5 mg/dL

## 2024-10-24 NOTE — Progress Notes [1]
 Overnight Oximetry Note    Name: Mitchell Petersen   MRN: [id number]     DOB: July 26, 1986      Age: 38 y.o.  Admission Date: 10/22/2024     LOS: 1 day     Date of Service: 10/24/2024        Test Start Date: 10/23/24  Test Start Time: 21:39    Did the patient require initiation of oxygen? no    Did the patient have a SpO2 <= 88% for a cumulative of 5 minutes? no        Test Completed Date: 10/24/24  Test Completed Time: 04:44    Additional Comments: Pt. On room air throughout trial, had multiple low heart rate alarms.

## 2024-10-25 ENCOUNTER — Inpatient Hospital Stay
Admission: EM | Admit: 2024-10-22 | Discharge: 2024-10-25 | Disposition: A | Discharge status: Home / Self Care | Admitting: Student in an Organized Health Care Education/Training Program

## 2024-10-25 ENCOUNTER — Encounter: Admit: 2024-10-25 | Discharge: 2024-10-25

## 2024-10-25 DIAGNOSIS — Z886 Allergy status to analgesic agent status: Secondary | ICD-10-CM

## 2024-10-25 DIAGNOSIS — K219 Gastro-esophageal reflux disease without esophagitis: Secondary | ICD-10-CM

## 2024-10-25 DIAGNOSIS — Z79890 Hormone replacement therapy (postmenopausal): Secondary | ICD-10-CM

## 2024-10-25 DIAGNOSIS — F17210 Nicotine dependence, cigarettes, uncomplicated: Secondary | ICD-10-CM

## 2024-10-25 DIAGNOSIS — Z9049 Acquired absence of other specified parts of digestive tract: Secondary | ICD-10-CM

## 2024-10-25 DIAGNOSIS — K760 Fatty (change of) liver, not elsewhere classified: Secondary | ICD-10-CM

## 2024-10-25 DIAGNOSIS — K589 Irritable bowel syndrome without diarrhea: Secondary | ICD-10-CM

## 2024-10-25 DIAGNOSIS — Z86718 Personal history of other venous thrombosis and embolism: Secondary | ICD-10-CM

## 2024-10-25 DIAGNOSIS — Z885 Allergy status to narcotic agent status: Secondary | ICD-10-CM

## 2024-10-25 DIAGNOSIS — Z659 Problem related to unspecified psychosocial circumstances: Secondary | ICD-10-CM

## 2024-10-25 DIAGNOSIS — D649 Anemia, unspecified: Secondary | ICD-10-CM

## 2024-10-25 DIAGNOSIS — E876 Hypokalemia: Secondary | ICD-10-CM

## 2024-10-25 DIAGNOSIS — Z91040 Latex allergy status: Secondary | ICD-10-CM

## 2024-10-25 DIAGNOSIS — Z5971 Insufficient health insurance coverage: Secondary | ICD-10-CM

## 2024-10-25 DIAGNOSIS — A044 Other intestinal Escherichia coli infections: Secondary | ICD-10-CM

## 2024-10-25 DIAGNOSIS — E860 Dehydration: Secondary | ICD-10-CM

## 2024-10-25 DIAGNOSIS — Z833 Family history of diabetes mellitus: Secondary | ICD-10-CM

## 2024-10-25 DIAGNOSIS — Z87442 Personal history of urinary calculi: Secondary | ICD-10-CM

## 2024-10-25 LAB — POC GLUCOSE: ~~LOC~~ BKR POC GLUCOSE: 134 mg/dL — ABNORMAL HIGH (ref 70–100)

## 2024-10-25 MED ORDER — ERGOCALCIFEROL (VITAMIN D2) 1,250 MCG (50,000 UNIT) PO CAP
50000 [IU] | ORAL_CAPSULE | ORAL | 0 refills | 56.00000 days | Status: AC
Start: 2024-10-25 — End: ?

## 2024-10-25 MED ORDER — AZITHROMYCIN 500 MG PO TAB
500 mg | ORAL_TABLET | Freq: Every day | ORAL | 0 refills | 5.00000 days | Status: AC
Start: 2024-10-25 — End: ?

## 2024-10-25 MED ORDER — HYOSCYAMINE SULFATE 0.125 MG PO TBDI
.125 mg | ORAL_TABLET | SUBLINGUAL | 0 refills | 5.00000 days | Status: AC | PRN
Start: 2024-10-25 — End: ?

## 2024-10-25 MED ORDER — POLYETHYLENE GLYCOL 3350 17 GRAM PO PWPK
17 g | Freq: Every day | ORAL | 0 refills | 22.00000 days | Status: AC | PRN
Start: 2024-10-25 — End: ?

## 2024-10-25 MED ORDER — ONDANSETRON 4 MG PO TBDI
4 mg | ORAL_TABLET | ORAL | 0 refills | 8.00000 days | Status: AC | PRN
Start: 2024-10-25 — End: ?

## 2024-10-25 MED ORDER — SENNOSIDES-DOCUSATE SODIUM 8.6-50 MG PO TAB
1 | Freq: Every day | ORAL | 0 refills | 30.00000 days | Status: AC | PRN
Start: 2024-10-25 — End: ?

## 2024-10-25 MED ORDER — PANTOPRAZOLE 40 MG PO TBEC
40 mg | ORAL_TABLET | Freq: Every day | ORAL | 0 refills | 90.00000 days | Status: AC
Start: 2024-10-25 — End: ?

## 2024-10-25 MED ORDER — OXYCODONE 5 MG PO TAB
5 mg | ORAL_TABLET | ORAL | 0 refills | 6.00000 days | Status: AC | PRN
Start: 2024-10-25 — End: ?

## 2024-10-25 NOTE — Case Mgmt DC Plan [600024]
 Inpatient Medication Voucher Determination    Date:  10/25/2024    Primary Insurance: None  Rx Coverage: None  Medicaid pending: No  Medicare Part D [place]: No  VA Benefits (include location of services if applicable): None  Date of last Medication Voucher: na    Medication needs discussed with attending and pharmacy: Yes:    Date cost of medication discussed with patient and/or family: 10-25-24  Community resources provided for ongoing medication needs: yes  What is ongoing plan for obtaining medications as needed: pt to establish care at [facility]     Is the patient approved for a Medication Voucher? Yes   Rationale for Denial, if above response is NO: N/A    Exception Voucher approved? No  Schedule II Opiods:  Oxycodone , Hydrocodone, Fentanyl , Hydromorphone , & Morphine  can be included on voucher: No  Generic Drug List ($4.99) medications and OTC medications can be included on voucher: Yes  Refill authorized: No  Name of medication(s) to be refilled and rationale: na  Other information: na    General Parameters:   NCM has authorized a Medication Voucher for up to $150.  If Voucher is over this amount, please contact primary NCM or On-Call NCM.    Vouchers can be provided once during a 6-month period.   Vouchers cannot include nicotine patches or co-pays.    Vouchers should include only new medications; ongoing refills should not be authorized without long-term plan for payment of the medication documented in this note.  No more than a 30-day supply should be authorized.   Please contact NCM with needs outside of these parameters if rationale not documented above.        [name] Patient Nurse Case Manager [facility]  Ph [phone]  Available on Voalte

## 2024-10-25 NOTE — Discharge Instr - Appointments [65]
 Please follow up with your PCP in the next 7-10 days.

## 2024-10-25 NOTE — Progress Notes [1]
 BH 45 END OF SHIFT/ JHFRAT NOTE    Admission Date: 10/22/2024    Acute events, interventions, provider communication: N/A    Patient Interventions and Education  Fall Risk/JHFRAT Interventions and Education: (Charting when applicable)  Elimination Interventions : N/A  Medications : Educate patient on medication side effects  Patient Care Equipment: Does not need assistance with patient care equipment when ambulating  Mobility: N/A  Cognition: N/A  Risk for Moderate/Major Injury: Active Anticoagulation    2. Restraints:  No     Restraint Goal: Patient will be free from injury while physically restrained.  See Docflowsheet for restraint documentation, interventions, education, etc.    Intake and Output:       Intake/Output Summary (Last 24 hours) at 10/25/2024 1545  Last data filed at 10/25/2024 1106  Gross per 24 hour   Intake 300 ml   Output 0 ml   Net 300 ml              Last Bowel Movement Date: 10/24/24    Mitchell Petersen [name] discharged on 10/25/2024.   [name]  Discharge instructions reviewed with patient.  Valuables returned:    .  Home medications:    .  Functional assessment at discharge complete: Yes .

## 2024-10-25 NOTE — Progress Notes [1]
 BH 45 END OF SHIFT/ JHFRAT NOTE    Admission Date: 10/22/2024    Acute events, interventions, provider communication: NA    Patient Interventions and Education  Fall Risk/JHFRAT Interventions and Education: (Charting when applicable)  Elimination Interventions : N/A  Medications : Educate patient on medication side effects  Patient Care Equipment: Does not need assistance with patient care equipment when ambulating, Ensure environment is free of clutter and walkways are clear from tripping hazards, and Assess need for patient equipment and remove if not in use  Mobility: N/A  Cognition: N/A  Risk for Moderate/Major Injury: Active Anticoagulation    2. Restraints:  No     Restraint Goal: Patient will be free from injury while physically restrained.  See Docflowsheet for restraint documentation, interventions, education, etc.    Intake and Output:       Intake/Output Summary (Last 24 hours) at 10/25/2024 0818  Last data filed at 10/25/2024 0438  Gross per 24 hour   Intake 0 ml   Output 0 ml   Net 0 ml              Last Bowel Movement Date: 10/24/24

## 2024-10-25 NOTE — Progress Notes [1]
 General Progress Note    Name:  Mitchell Petersen   Unijb'd Date:  10/25/2024  Admission Date: 10/22/2024  LOS: 2 days                    Principal Problem:    Hypophosphatemia           Assessment/Plan:      Mitchell Petersen is 77M former smoker w/ pancreatitis, GERD, who presented to the ED 12/22 with dizziness and near syncope, disorientation/confusion, diaphoresis in setting of diarrhea, abd pain, n/v and poor po intake. On arrival, pt hemodynamically stable while labs showed severely low phosphorus; ct a/p w cont showed no acute findings. Pt admitted for further mgmt.       # Hypophosphatemia  Hypokalemia  # Confusion, resolved  # Weakness   # Near Syncope  - Patient developed episode while visiting his wife in the ICU, became very weak and endorse he does not remember what happened but per report had episode of near syncope. This is in association w/ diarrhea, abd discomfort, poor po intake  - Labs: Phosphorus <1.0, UDS -ve, no leukocytosis, TSH 0.24 with normal T4.   - sx likely due to profound electrolyte disturbance (undetectable phosphorus)  - 12/23 TTE:    He left ventricular size, wall thickness and systolic function are normal. The ejection fraction by Simpson's biplane method is 65%. There are no segmental wall motion abnormalities.    The right ventricle is probably normal in size. The right ventricular systolic function is probably normal.    Normal biatrial size.    No significant valvular abnormalities.    No pericardial effusion.  Plan:  - 12/24: DCED IVF; PT DOING WELL W/ PO FLUIDS. STILL HASN'T BEEN ABLE TO TOLERATE SOLID foods    # Diarrhea  # Abdominal pain/ poor po intake  Hx IBS  - Endorse Abdominal pain today, Diarrhea x 15 episodes water  stools. Reports hx of GERD and IBS (usually has trouble w/ constipation)- however, these sx have been well controlled for a long while as he stopped drinking and made adjustments to his diet  - CTAP:   1. Bladder wall thickening and mild pericystic fat stranding, may be due to underdistention, however cystitis is a consideration and recommend correlation with urinalysis.   2. No other acute findings in the abdomen or pelvis.   3. Prior cholecystectomy and hepatic steatosis. No biliary ductal dilatation.   - c.diff negative  - 12/23 GI panel: +enteropathogenic e.coli  - GI consulted  Plan:  - PER GI: starting azithromycin - plan for 3d course  -  sx mgmt: tylenol , gi cocktail qid prn, bentyl , dilaudid  iv prn, oxycodone  prn; STARTING LEVSIN  FOR SPASMS    Bradycardia  - noted on tele; pt is asymptomatic and BP is nml/at goal  - reports that he is supposed to get sleep study to evaluate for OSA, but has not been able to yet- suspect this is contributing to pt's bradycardia (especially as it was noted while he was laying in bed and resting)  - o/n pulse ox: negative for hypoxia  Plan:  - will send referral for outpt sleep study on discharge  - continue to monitor on tele, keep K>4, Mg>2    Vitamin D  deficiency  - 12/23 vitamin D  4  Plan:  - starting supplement    # LLE Tenderness   - Doppler US  done, no evidence of acute DVT       DVT  ppx: lovenox     Fluids: DCED IVF  Electrolytes: monitor daily  Nutrition: regular  Lines: piv x1  Code status: full  PT/OT: na- pt ambulatory    Consults: GI      Dispo: home      Total Time Today was 50 minutes in the following activities: Preparing to see the patient, Obtaining and/or reviewing separately obtained history, Performing a medically appropriate examination and/or evaluation, Counseling and educating the patient/family/caregiver, Ordering medications, tests, or procedures, Documenting clinical information in the electronic or other health record, and Independently interpreting results (not separately reported) and communicating results to the patient/family/caregiver      Staff name:  Starlett Shears, MD Date:  10/25/2024   Med-private Team L  Team Pager [phone]    Available on [place], [place]    Mitchell Petersen is the preferred method of communication.   Please use the Med Private First Call for all patient-related communications. Personal Voaltes and pagers are not answered at all hours.  ________________________________________________________________________    Subjective  No acute event so/n. Vs and labs are overall stable. He is feeling really poorly this morning thoug. He had breakfast not too long ago, and feels like he ate too much, resulting in a lot of cramping pain. Denies any n/v, no more diarrhea. Denies any HA, vision changes, chest pain, dyspnea, lightheadedness/dizziness.     Medications  Scheduled Meds:acetaminophen  (TYLENOL  EXTRA STRENGTH) tablet 1,000 mg, 1,000 mg, Oral, Q6H*  amitriptyline /gabapentin /baclofen (#) 2/6/2 % topical cream, , Topical, Q8H  azithromycin  (ZITHROMAX ) tablet 500 mg, 500 mg, Oral, QDAY  dicyclomine  (BENTYL ) capsule 10 mg, 10 mg, Oral, ACHS  enoxaparin  (LOVENOX ) syringe 40 mg, 40 mg, Subcutaneous, BID  ERGOcalciferoL  (vitamin D2) (DRISDOL ) capsule 50,000 Units, 50,000 Units, Oral, Q7 Days  lidocaine  (LIDODERM ) 5 % topical patch 2 patch, 2 patch, Topical, QHS  pantoprazole  DR (PROTONIX ) tablet 40 mg, 40 mg, Oral, QDAY    Continuous Infusions:      PRN and Respiratory Meds:alum-mag hydroxide-simeth QID PRN **AND** lidocaine  hcl viscous QID PRN, HYDROmorphone  Q4H PRN, hyoscyamine  Q4H PRN, melatonin QHS PRN, ondansetron  Q6H PRN **OR** ondansetron  Q6H PRN, oxyCODONE  Q4H PRN, polyethylene glycol 3350  QDAY PRN, sennosides-docusate sodium  QDAY PRN        Objective:                          Vital Signs: Last Filed                 Vital Signs: 24 Hour Range   BP: 113/63 (12/25 1108)  Temp: 36.7 ?C (98 ?F) (12/25 1109)  Pulse: 58 (12/25 0737)  Respirations: 18 PER MINUTE (12/25 1109)  SpO2: 99 % (12/25 1109)  O2 Device: Non-rebreather (12/25 1109) BP: (99-128)/(55-84)   Temp:  [36.4 ?C (97.5 ?F)-36.8 ?C (98.2 ?F)]   Pulse:  [45-61]   Respirations:  [16 PER MINUTE-18 PER MINUTE]   SpO2:  [99 %-100 %] O2 Device: Non-rebreather   Intensity Pain Scale (Self Report): 6 (10/24/24 2151) Vitals:    10/22/24 1612 10/23/24 1146   Weight: 133 kg (293 lb 3.4 oz) 129 kg (284 lb 6.3 oz)       Intake/Output Summary:  (Last 24 hours)    Intake/Output Summary (Last 24 hours) at 10/25/2024 1134  Last data filed at 10/25/2024 0438  Gross per 24 hour   Intake 0 ml   Output 0 ml   Net 0 ml        Physical exam:  General: NAD, tired  Cardiovascular: bradycardia, regular rhythm, no murmurs.   Pulmonary/Chest: normal respiratory effort. Bilateral breath sounds are clear. No wheezes or rales or rhonchi.  Abdominal: Soft, nondistended, +tender (RUQ/epigastric region- improving), and no mass. Bowel sounds are normal.  Ext: No pitting edema in extremities. Nml distal pulses. No cyanosis, no clubbing  Neurological: A&Ox4, no focal findings    Lab/Imaging Reviewed  24-hour labs:    Results for orders placed or performed during the hospital encounter of 10/22/24 (from the past 24 hours)   POC GLUCOSE    Collection Time: 10/24/24  1:12 PM   Result Value Ref Range    Glucose, POC 88 70 - 100 mg/dL   POC GLUCOSE    Collection Time: 10/24/24  3:14 PM   Result Value Ref Range    Glucose, POC 105 (H) 70 - 100 mg/dL   POC GLUCOSE    Collection Time: 10/24/24  9:15 PM   Result Value Ref Range    Glucose, POC 134 (H) 70 - 100 mg/dL   CBC AND DIFF    Collection Time: 10/25/24  4:38 AM   Result Value Ref Range    White Blood Cells 7.00 4.50 - 11.00 10*3/uL    Red Blood Cells 4.21 (L) 4.40 - 5.50 10*6/uL    Hemoglobin 12.4 (L) 13.5 - 16.5 g/dL    Hematocrit 63.4 (L) 40.0 - 50.0 %    MCV 86.8 80.0 - 100.0 fL    MCH 29.5 26.0 - 34.0 pg    MCHC 33.9 32.0 - 36.0 g/dL    RDW 85.8 88.9 - 84.9 %    Platelet Count 221 150 - 400 10*3/uL    MPV 7.7 7.0 - 11.0 fL    Neutrophils 48.2 41.0 - 77.0 %    Lymphocytes 40.4 24.0 - 44.0 %    Monocytes 7.5 4.0 - 12.0 %    Eosinophils 3.0 0.0 - 5.0 %    Basophils 0.9 0.0 - 2.0 %    Absolute Neutrophil Count 3.40 1.80 - 7.00 10*3/uL    Absolute Lymph Count 2.80 1.00 - 4.80 10*3/uL    Absolute Monocyte Count 0.50 0.00 - 0.80 10*3/uL    Absolute Eosinophil Count 0.20 0.00 - 0.45 10*3/uL    Absolute Basophil Count 0.10 0.00 - 0.20 10*3/uL   COMPREHENSIVE METABOLIC PANEL    Collection Time: 10/25/24  4:38 AM   Result Value Ref Range    Sodium 141 137 - 147 mmol/L    Potassium 3.9 3.5 - 5.1 mmol/L    Chloride 108 98 - 110 mmol/L    Glucose 82 70 - 100 mg/dL    Blood Urea Nitrogen 9 7 - 25 mg/dL    Creatinine 9.01 9.59 - 1.24 mg/dL    Calcium 8.5 8.5 - 89.3 mg/dL    Total Protein 6.4 6.0 - 8.0 g/dL    Total Bilirubin 0.3 0.2 - 1.3 mg/dL    Albumin 3.7 3.5 - 5.0 g/dL    Alk Phosphatase 50 25 - 110 U/L    AST 16 7 - 40 U/L    ALT 16 7 - 56 U/L    CO2 23 21 - 30 mmol/L    Anion Gap 10 3 - 12    Glomerular Filtration Rate (GFR) >60 >60 mL/min   MAGNESIUM     Collection Time: 10/25/24  4:38 AM   Result Value Ref Range    Magnesium  2.4 1.6 - 2.6 mg/dL   PHOSPHORUS  Collection Time: 10/25/24  4:38 AM   Result Value Ref Range    Phosphorus 3.8 2.0 - 4.5 mg/dL

## 2024-10-25 NOTE — Discharge Summary [5]
 Discharge Summary      Name: Mitchell Petersen  Medical Record Number: [id number]        Account Number:  [id number]  Date Of Birth:  21-Dec-1985                         Age:  38 y.o.  Admit date:  10/22/2024                     Discharge date: 10/25/2024      Discharge Attending:  Starlett Shears, MD    Discharge Summary Completed By: Starlett Shears, MD    Service: Med Private L- [phone]    Reason for hospitalization:  Hypophosphatemia [E83.39]    Primary Discharge Diagnosis:   Same as Above    Hospital Diagnoses:  Hospital Problems        Resolved Problems    * (Principal) RESOLVED: Hypophosphatemia     Present on Admission:   (Resolved) Hypophosphatemia        Significant Past Medical History        Gastrointestinal disorder      Comment:  GERD  Thyroid disease    Allergies   Adhesive tape (rosins), Celebrex [celecoxib], Latex, Toradol [ketorolac], and Tramadol    Brief Hospital Course     Mitchell Petersen is 56M former smoker w/ pancreatitis, GERD, who presented to the ED 12/22 with dizziness and near syncope, disorientation/confusion, diaphoresis in setting of diarrhea, abd pain, n/v and poor po intake. On arrival, pt hemodynamically stable while labs showed severely low phosphorus; ct a/p w cont showed no acute findings. Pt admitted for further mgmt.         # Hypophosphatemia  Hypokalemia  # Confusion, resolved  # Weakness   # Near Syncope  - Patient developed episode while visiting his wife in the ICU, became very weak and endorse he does not remember what happened but per report had episode of near syncope. This is in association w/ diarrhea, abd discomfort, poor po intake  - Labs: Phosphorus <1.0, UDS -ve, no leukocytosis, TSH 0.24 with normal T4.   - sx likely due to profound electrolyte disturbance (undetectable phosphorus)  - 12/23 TTE:    He left ventricular size, wall thickness and systolic function are normal. The ejection fraction by Simpson's biplane method is 65%. There are no segmental wall motion abnormalities.    The right ventricle is probably normal in size. The right ventricular systolic function is probably normal.    Normal biatrial size.    No significant valvular abnormalities.    No pericardial effusion.  Plan:  - by dc, pt doing well w/ po intake     # Diarrhea  # Abdominal pain/ poor po intake  Hx IBS  - Endorse Abdominal pain today, Diarrhea x 15 episodes water  stools. Reports hx of GERD and IBS (usually has trouble w/ constipation)- however, these sx have been well controlled for a long while as he stopped drinking and made adjustments to his diet  - CTAP:   1. Bladder wall thickening and mild pericystic fat stranding, may be due to underdistention, however cystitis is a consideration and recommend correlation with urinalysis.   2. No other acute findings in the abdomen or pelvis.   3. Prior cholecystectomy and hepatic steatosis. No biliary ductal dilatation.   - c.diff negative  - 12/23 GI panel: +enteropathogenic e.coli  - GI consulted  Plan:  - PER GI:  starting azithromycin - plan for 3d course  -  sx mgmt: tylenol , gi cocktail qid prn, bentyl , dilaudid  iv prn, oxycodone  prn, levsin  prn     Bradycardia  - noted on tele; pt is asymptomatic and BP is nml/at goal  - reports that he is supposed to get sleep study to evaluate for OSA, but has not been able to yet- suspect this is contributing to pt's bradycardia (especially as it was noted while he was laying in bed and resting)  - o/n pulse ox: negative for hypoxia  Plan:  - will send referral for outpt sleep study on discharge     Vitamin D  deficiency  - 12/23 vitamin D  4  Plan:  - starting supplement     # LLE Tenderness   - Doppler US  done, no evidence of acute DVT       Day of discharge exam notable for:   General: NAD, tired  Cardiovascular: bradycardia, regular rhythm, no murmurs.   Pulmonary/Chest: normal respiratory effort. Bilateral breath sounds are clear. No wheezes or rales or rhonchi.  Abdominal: Soft, nondistended, +tender (RUQ/epigastric region- much improved), and no mass. Bowel sounds are normal.  Ext: No pitting edema in extremities. Nml distal pulses. No cyanosis, no clubbing  Neurological: A&Ox4, no focal finding    Items Needing Follow Up   Pending items or areas that need to be addressed at follow up: na    Pending Labs and Follow Up Radiology    Pending labs and/or radiology review at this time of discharge are listed below: Please note- any labs with collected status will not have a result; if this area is blank, there are no items for review.         Medications        Medication List        START taking these medications      ERGOcalciferoL  (vitamin D2) 1,250 mcg (50,000 unit) capsule  Commonly known as: DRISDOL   Dose: 50,000 Units  Take one capsule by mouth every 7 days. Indications: low vitamin D  levels  For: low vitamin D  levels  Quantity: 12 capsule  Refills: 0     hyoscyamine  0.125 mg rapid dissolve tablet  Commonly known as: ANASPAZ   Dose: 0.125 mg  Place one tablet under tongue every 4 hours as needed. Indications: irritable colon  For: irritable colon  Quantity: 30 tablet  Refills: 0     ondansetron  4 mg rapid dissolve tablet  Commonly known as: ZOFRAN  ODT  Dose: 4 mg  Dissolve one tablet by mouth every 6 hours as needed. Place on tongue to dissolve.  Indications: nausea  For: nausea  Quantity: 30 tablet  Refills: 0     oxyCODONE  5 mg tablet  Commonly known as: ROXICODONE   Dose: 5 mg  Take one tablet by mouth every 4 hours as needed. Indications: pain  For: pain  Quantity: 8 tablet  Refills: 0     pantoprazole  DR 40 mg tablet  Commonly known as: PROTONIX   Dose: 40 mg  Take one tablet by mouth daily. Indications: gastroesophageal reflux disease  For: gastroesophageal reflux disease  Quantity: 90 tablet  Refills: 0     polyethylene glycol 3350  17 g packet  Commonly known as: MIRALAX   Dose: 17 g  Take one packet by mouth daily as needed (Constipation - Second Line). Indications: constipation  For: constipation  Refills: 0     sennosides-docusate sodium  8.6/50 mg tablet  Commonly known as: SENOKOT-S  Dose: 1 tablet  Take one tablet by mouth daily as needed (Constipation - First Line). Indications: constipation  For: constipation  Refills: 0            CONTINUE taking these medications      acetaminophen  500 mg tablet  Commonly known as: TYLENOL  EXTRA STRENGTH  Dose: 1,000 mg  Take two tablets by mouth every 6 hours as needed for Pain. Max of 4,000 mg of acetaminophen  in 24 hours.  Refills: 0     dicyclomine  20 mg tablet  Commonly known as: BENTYL   Dose: 20 mg  Take one tablet by mouth daily.  Refills: 0     famotidine 20 mg tablet  Commonly known as: PEPCID  Dose: 20 mg  Take one tablet by mouth daily as needed.  Refills: 0            ASK your doctor about these medications      azithromycin  500 mg tablet  Commonly known as: ZITHROMAX   Dose: 500 mg  Take one tablet by mouth daily for 1 day. Indications: enteropathogenic ecoli  For: enteropathogenic ecoli  Quantity: 1 tablet  Refills: 0  Ask about: Should I take this medication?               Where to Get Your Medications        These medications were sent to [facility]  [address], [place]  [place] [address]      Hours: Monday-Friday 6 a.m.-9 p.m. Saturday-Sunday 9 a.m.-5 p.m. Phone: [phone] Phone: [phone]   azithromycin  500 mg tablet  ERGOcalciferoL  (vitamin D2) 1,250 mcg (50,000 unit) capsule  hyoscyamine  0.125 mg rapid dissolve tablet  ondansetron  4 mg rapid dissolve tablet  oxyCODONE  5 mg tablet  pantoprazole  DR 40 mg tablet       You can get these medications from any pharmacy    You don't need a prescription for these medications  polyethylene glycol 3350  17 g packet  sennosides-docusate sodium  8.6/50 mg tablet         Return Appointments and Scheduled Appointments   No appointment scheduled  Additional Discharge Appointments    Please follow up with your PCP in the next 7-10 days.          Consults, Procedures, Diagnostics, Micro, Pathology   Consults: GI  Surgical Procedures & Dates: None  Significant Diagnostic Studies, Micro and Procedures: noted in brief hospital course  Significant Pathology: none                       Discharge Disposition, Condition   Patient Disposition: Home or Self Care [01]  Condition at Discharge: Stable    Code Status   Full Code    Patient Instructions     Activity       Activity as Tolerated   As directed      It is important to keep increasing your activity level after you leave the hospital.  Moving around can help prevent blood clots, lung infection (pneumonia) and other problems.  Gradually increasing the number of times you are up moving around will help you return to your normal activity level more quickly.  Continue to increase the number of times you are up to the chair and walking daily to return to your normal activity level. Begin to work toward your normal activity level at discharge          Diet       Regular Diet  As directed      You have no dietary restriction. Please continue with a healthy balanced diet.             Discharge education provided to patient., Signs and Symptoms:   Report these signs and symptoms       Report These Signs and Symptoms   As directed      Please contact your doctor if you have any of the following symptoms: temperature higher than 100.4 degrees F, uncontrolled pain, persistent nausea and/or vomiting, difficulty breathing, chest pain, severe abdominal pain, headache, unable to urinate, unable to have bowel movement, or drainage with a foul odor        , Education: , and Others Instructions:   Other Orders       Questions About Your Stay   Complete by: As directed      Discharging attending physician: CHARM BENCE    Order comments: For questions or concerns regarding your hospital stay, call [phone].            Additional Orders: Case Management, Supplies, Home Health     Home Health/DME       None            Discharge Attending Time: I, the attending, spent greater than 30 minutes in counseling pt, coordinating discharge care, placing discharge orders and complete discharge summary.    Signed:  Bence Charm, MD  10/25/2024      cc:  Primary Care Physician:  Kyung Goodell   Verified    Referring physicians:  No ref. provider found   Additional provider(s):        Did we miss something? If additional records are needed, please fax a request on office letterhead to [phone]. Please include the patient's name, date of birth, fax number and type of information needed. Additional request can be made by email at [email or web address]. For general questions of information about electronic records sharing, call [phone].
